# Patient Record
Sex: Female | Born: 1945 | Race: White | Hispanic: No | Marital: Married | Smoking: Never smoker
Health system: Southern US, Community
[De-identification: ages and names within clinical notes are randomized; demographics above are authoritative.]

## PROBLEM LIST (undated history)

## (undated) DIAGNOSIS — T7840XA Allergy, unspecified, initial encounter: Secondary | ICD-10-CM

## (undated) DIAGNOSIS — M1611 Unilateral primary osteoarthritis, right hip: Secondary | ICD-10-CM

## (undated) DIAGNOSIS — R112 Nausea with vomiting, unspecified: Secondary | ICD-10-CM

## (undated) DIAGNOSIS — N8110 Cystocele, unspecified: Secondary | ICD-10-CM

## (undated) DIAGNOSIS — E785 Hyperlipidemia, unspecified: Secondary | ICD-10-CM

## (undated) DIAGNOSIS — H269 Unspecified cataract: Secondary | ICD-10-CM

## (undated) DIAGNOSIS — Z9889 Other specified postprocedural states: Secondary | ICD-10-CM

## (undated) DIAGNOSIS — Z1211 Encounter for screening for malignant neoplasm of colon: Secondary | ICD-10-CM

## (undated) DIAGNOSIS — I8393 Asymptomatic varicose veins of bilateral lower extremities: Secondary | ICD-10-CM

## (undated) HISTORY — PX: EXCISION VAGINAL CYST: SHX5825

## (undated) HISTORY — DX: Unspecified cataract: H26.9

## (undated) HISTORY — DX: Unilateral primary osteoarthritis, right hip: M16.11

## (undated) HISTORY — DX: Allergy, unspecified, initial encounter: T78.40XA

## (undated) HISTORY — DX: Cystocele, unspecified: N81.10

## (undated) HISTORY — DX: Hyperlipidemia, unspecified: E78.5

---

## 1991-03-02 HISTORY — PX: BREAST BIOPSY: SHX20

## 1997-08-19 ENCOUNTER — Other Ambulatory Visit: Admission: RE | Admit: 1997-08-19 | Discharge: 1997-08-19 | Payer: Self-pay | Admitting: Family Medicine

## 2003-03-03 LAB — HM COLONOSCOPY: HM Colonoscopy: NORMAL

## 2003-12-31 ENCOUNTER — Ambulatory Visit: Payer: Self-pay | Admitting: Gastroenterology

## 2003-12-31 HISTORY — PX: COLONOSCOPY: SHX174

## 2004-12-09 ENCOUNTER — Ambulatory Visit: Payer: Self-pay | Admitting: Internal Medicine

## 2005-12-15 ENCOUNTER — Ambulatory Visit: Payer: Self-pay | Admitting: Internal Medicine

## 2005-12-21 ENCOUNTER — Ambulatory Visit: Payer: Self-pay | Admitting: Internal Medicine

## 2006-12-21 ENCOUNTER — Ambulatory Visit: Payer: Self-pay | Admitting: Internal Medicine

## 2008-01-08 ENCOUNTER — Ambulatory Visit: Payer: Self-pay | Admitting: Internal Medicine

## 2009-06-02 ENCOUNTER — Ambulatory Visit: Payer: Self-pay | Admitting: Family Medicine

## 2010-06-18 ENCOUNTER — Ambulatory Visit: Payer: Self-pay | Admitting: Internal Medicine

## 2011-07-18 ENCOUNTER — Ambulatory Visit: Payer: Self-pay | Admitting: Internal Medicine

## 2011-08-10 ENCOUNTER — Ambulatory Visit: Payer: Self-pay | Admitting: Internal Medicine

## 2012-02-13 ENCOUNTER — Ambulatory Visit: Payer: Self-pay | Admitting: Emergency Medicine

## 2012-02-13 LAB — URINALYSIS, COMPLETE
Bilirubin,UR: NEGATIVE
Glucose,UR: NEGATIVE mg/dL (ref 0–75)
Ketone: NEGATIVE
Specific Gravity: 1.02 (ref 1.003–1.030)
WBC UR: >30 /HPF (ref 0–5)

## 2012-08-29 ENCOUNTER — Ambulatory Visit: Payer: Self-pay | Admitting: Internal Medicine

## 2013-08-27 LAB — CBC AND DIFFERENTIAL: Hemoglobin: 13.5 g/dL (ref 12.0–16.0)

## 2013-08-27 LAB — LIPID PANEL
Cholesterol: 147 mg/dL (ref 0–200)
HDL: 52 mg/dL (ref 35–70)
LDL CALC: 80 mg/dL
Triglycerides: 73 mg/dL (ref 40–160)

## 2013-08-27 LAB — TSH: TSH: 2.6 u[IU]/mL (ref <=5.90)

## 2013-09-02 ENCOUNTER — Ambulatory Visit: Payer: Self-pay | Admitting: Emergency Medicine

## 2013-09-11 ENCOUNTER — Ambulatory Visit: Payer: Self-pay | Admitting: Internal Medicine

## 2013-09-11 LAB — HM MAMMOGRAPHY: HM Mammogram: NORMAL

## 2013-12-25 ENCOUNTER — Ambulatory Visit: Payer: Self-pay | Admitting: Internal Medicine

## 2014-01-08 ENCOUNTER — Ambulatory Visit: Payer: Self-pay | Admitting: Internal Medicine

## 2014-09-17 ENCOUNTER — Other Ambulatory Visit: Payer: Self-pay | Admitting: Internal Medicine

## 2014-09-17 ENCOUNTER — Encounter: Payer: Self-pay | Admitting: Internal Medicine

## 2014-09-17 DIAGNOSIS — N819 Female genital prolapse, unspecified: Secondary | ICD-10-CM | POA: Insufficient documentation

## 2014-09-17 DIAGNOSIS — M25562 Pain in left knee: Secondary | ICD-10-CM | POA: Insufficient documentation

## 2014-09-17 DIAGNOSIS — N6019 Diffuse cystic mastopathy of unspecified breast: Secondary | ICD-10-CM | POA: Insufficient documentation

## 2014-09-17 DIAGNOSIS — N951 Menopausal and female climacteric states: Secondary | ICD-10-CM | POA: Insufficient documentation

## 2014-09-17 DIAGNOSIS — J3089 Other allergic rhinitis: Secondary | ICD-10-CM | POA: Insufficient documentation

## 2014-11-21 ENCOUNTER — Ambulatory Visit (INDEPENDENT_AMBULATORY_CARE_PROVIDER_SITE_OTHER): Payer: Medicare Other

## 2014-11-21 ENCOUNTER — Other Ambulatory Visit: Payer: Self-pay | Admitting: Internal Medicine

## 2014-11-21 DIAGNOSIS — Z23 Encounter for immunization: Secondary | ICD-10-CM | POA: Diagnosis not present

## 2014-12-20 ENCOUNTER — Encounter: Payer: Self-pay | Admitting: Internal Medicine

## 2015-01-10 ENCOUNTER — Encounter: Payer: Self-pay | Admitting: Internal Medicine

## 2015-01-10 ENCOUNTER — Ambulatory Visit (INDEPENDENT_AMBULATORY_CARE_PROVIDER_SITE_OTHER): Payer: Medicare Other | Admitting: Internal Medicine

## 2015-01-10 VITALS — BP 132/72 | HR 72 | Ht 67.0 in | Wt 173.6 lb

## 2015-01-10 DIAGNOSIS — L089 Local infection of the skin and subcutaneous tissue, unspecified: Secondary | ICD-10-CM

## 2015-01-10 DIAGNOSIS — L723 Sebaceous cyst: Secondary | ICD-10-CM

## 2015-01-10 DIAGNOSIS — Z1239 Encounter for other screening for malignant neoplasm of breast: Secondary | ICD-10-CM

## 2015-01-10 MED ORDER — AMOXICILLIN-POT CLAVULANATE 875-125 MG PO TABS
1.0000 | ORAL_TABLET | Freq: Two times a day (BID) | ORAL | Status: DC
Start: 2015-01-10 — End: 2016-01-23

## 2015-01-10 NOTE — Progress Notes (Signed)
Date:  01/10/2015   Name:  Dana Cameron   DOB:  12-31-1945   MRN:  [id number]   Chief Complaint: Breast Mass HPI Patient noted onset of a lump in her medial upper left breast about a week ago. It was tender and swollen. She had some purulent drainage and now the area is smaller. She placed a Band-Aid over it and developed a rash from the adhesive. She is due for a mammogram. She denies any other breast mass, skin change, or nipple discharge. She has no previous history of sebaceous cyst or skin infections.   Review of Systems  Constitutional: Negative for fever, chills and fatigue.  Respiratory: Negative for chest tightness and shortness of breath.   Cardiovascular: Negative for chest pain.  Skin: Rash: and lump.    Patient Active Problem List   Diagnosis Date Noted  . 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 Allergic rhinitis 09/17/2014  . 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 Gonalgia 09/17/2014  . Hot flash, menopausal 09/17/2014    Prior to Admission medications   Medication Sig Start Date End Date Taking? Authorizing Provider  Cholecalciferol (VITAMIN D3) 5000 UNITS CAPS Take 1 capsule by mouth daily.   Yes Historical Provider, MD  conjugated estrogens (PREMARIN) vaginal cream Place 1 application vaginally 2 (two) times a week.   Yes Historical Provider, MD  fluticasone (FLONASE) 50 MCG/ACT nasal spray USE TWO SPRAY(S) IN EACH NOSTRIL ONCE DAILY 09/17/14  Yes Glean Hess, MD  Omega 3 1000 MG CAPS Take 1 capsule by mouth daily.   Yes Historical Provider, MD  Progesterone Micronized 10 % CREA Place onto the skin.   Yes Historical Provider, MD    No Known Allergies  Past Surgical History  Procedure Laterality Date  . Excision vaginal cyst      Social History  Substance Use Topics  . Smoking status: Never Smoker   . Smokeless tobacco: None  . Alcohol Use: No     Medication list has been reviewed and updated.   Physical Exam  Constitutional: She appears well-developed  and well-nourished. No distress.  Cardiovascular: Normal rate, regular rhythm and normal heart sounds.   Pulmonary/Chest: Effort normal and breath sounds normal. She has no wheezes. Right breast exhibits no mass, no nipple discharge, no skin change and no tenderness. Left breast exhibits mass. Left breast exhibits no nipple discharge, no skin change and no tenderness.    Nursing note and vitals reviewed.   BP 132/72 mmHg  Pulse 72  Ht 5\' 7"  (1.702 m)  Wt 173 lb 9.6 oz (78.744 kg)  BMI 27.18 kg/m2  Assessment and Plan: 1. Infected sebaceous cyst of skin Warm compresses 3 times a day - amoxicillin-clavulanate (AUGMENTIN) 875-125 MG tablet; Take 1 tablet by mouth 2 (two) times daily.  Dispense: 20 tablet; Refill: 0  2. Breast cancer screening - MM DIGITAL SCREENING BILATERAL; Future   Halina Maidens, MD [facility]  01/10/2015

## 2015-01-14 ENCOUNTER — Ambulatory Visit
Admission: RE | Admit: 2015-01-14 | Discharge: 2015-01-14 | Disposition: A | Discharge status: Home / Self Care | Payer: Medicare Other | Source: Ambulatory Visit | Attending: Internal Medicine | Admitting: Internal Medicine

## 2015-01-14 DIAGNOSIS — Z1239 Encounter for other screening for malignant neoplasm of breast: Secondary | ICD-10-CM

## 2015-01-14 DIAGNOSIS — Z1231 Encounter for screening mammogram for malignant neoplasm of breast: Secondary | ICD-10-CM | POA: Insufficient documentation

## 2015-01-21 ENCOUNTER — Encounter: Payer: Self-pay | Admitting: Internal Medicine

## 2015-01-21 ENCOUNTER — Ambulatory Visit (INDEPENDENT_AMBULATORY_CARE_PROVIDER_SITE_OTHER): Payer: Medicare Other | Admitting: Internal Medicine

## 2015-01-21 VITALS — BP 122/72 | HR 60 | Ht 67.0 in | Wt 174.4 lb

## 2015-01-21 DIAGNOSIS — N951 Menopausal and female climacteric states: Secondary | ICD-10-CM | POA: Diagnosis not present

## 2015-01-21 DIAGNOSIS — N6019 Diffuse cystic mastopathy of unspecified breast: Secondary | ICD-10-CM

## 2015-01-21 DIAGNOSIS — Z0000 Encounter for general adult medical examination without abnormal findings: Secondary | ICD-10-CM | POA: Diagnosis not present

## 2015-01-21 DIAGNOSIS — N8110 Cystocele, unspecified: Secondary | ICD-10-CM | POA: Diagnosis not present

## 2015-01-21 DIAGNOSIS — IMO0002 Reserved for concepts with insufficient information to code with codable children: Secondary | ICD-10-CM

## 2015-01-21 DIAGNOSIS — Z23 Encounter for immunization: Secondary | ICD-10-CM

## 2015-01-21 LAB — POCT URINALYSIS DIPSTICK
BILIRUBIN UA: NEGATIVE
GLUCOSE UA: NEGATIVE
KETONES UA: NEGATIVE
LEUKOCYTES UA: NEGATIVE
Nitrite, UA: NEGATIVE
PH UA: 5
Protein, UA: NEGATIVE
Spec Grav, UA: 1.01
Urobilinogen, UA: 0.2

## 2015-01-21 NOTE — Patient Instructions (Addendum)
Pneumococcal Conjugate Vaccine (PCV13)  1. Why get vaccinated? Vaccination can protect both children and adults from pneumococcal disease. Pneumococcal disease is caused by bacteria that can spread from person to person through close contact. It can cause ear infections, and it can also lead to more serious infections of the:  Lungs (pneumonia),  Blood (bacteremia), and  Covering of the brain and spinal cord (meningitis). Pneumococcal pneumonia is most common among adults. Pneumococcal meningitis can cause deafness and brain damage, and it kills about 1 child in 10 who get it. Anyone can get pneumococcal disease, but children under 28 years of age and adults 43 years and older, people with certain medical conditions, and cigarette smokers are at the highest risk. Before there was a vaccine, the [place] saw:  more than 700 cases of meningitis,  about 13,000 blood infections,  about 5 million ear infections, and  about 200 deaths in children under 5 each year from pneumococcal disease. Since vaccine became available, severe pneumococcal disease in these children has fallen by 88%. About 18,000 older adults die of pneumococcal disease each year in the [place]. Treatment of pneumococcal infections with penicillin and other drugs is not as effective as it used to be, because some strains of the disease have become resistant to these drugs. This makes prevention of the disease, through vaccination, even more important. 2. PCV13 vaccine Pneumococcal conjugate vaccine (called PCV13) protects against 13 types of pneumococcal bacteria. PCV13 is routinely given to children at 2, 4, 6, and 65-74 months of age. It is also recommended for children and adults 70 to 70 years of age with certain health conditions, and for all adults 64 years of age and older. Your doctor can give you details. 3. Some people should not get this vaccine Anyone who has ever had a life-threatening allergic reaction  to a dose of this vaccine, to an earlier pneumococcal vaccine called PCV7, or to any vaccine containing diphtheria toxoid (for example, DTaP), should not get PCV13. Anyone with a severe allergy to any component of PCV13 should not get the vaccine. Tell your doctor if the person being vaccinated has any severe allergies. If the person scheduled for vaccination is not feeling well, your healthcare provider might decide to reschedule the shot on another day. 4. Risks of a vaccine reaction With any medicine, including vaccines, there is a chance of reactions. These are usually mild and go away on their own, but serious reactions are also possible. Problems reported following PCV13 varied by age and dose in the series. The most common problems reported among children were:  About half became drowsy after the shot, had a temporary loss of appetite, or had redness or tenderness where the shot was given.  About 1 out of 3 had swelling where the shot was given.  About 1 out of 3 had a mild fever, and about 1 in 20 had a fever over 102.55F.  Up to about 8 out of 10 became fussy or irritable. Adults have reported pain, redness, and swelling where the shot was given; also mild fever, fatigue, headache, chills, or muscle pain. Young children who get PCV13 along with inactivated flu vaccine at the same time may be at increased risk for seizures caused by fever. Ask your doctor for more information. Problems that could happen after any vaccine:  People sometimes faint after a medical procedure, including vaccination. Sitting or lying down for about 15 minutes can help prevent fainting, and injuries caused by a fall.  Tell your doctor if you feel dizzy, or have vision changes or ringing in the ears.  Some older children and adults get severe pain in the shoulder and have difficulty moving the arm where a shot was given. This happens very rarely.  Any medication can cause a severe allergic reaction. Such  reactions from a vaccine are very rare, estimated at about 1 in a million doses, and would happen within a few minutes to a few hours after the vaccination. As with any medicine, there is a very small chance of a vaccine causing a serious injury or death. The safety of vaccines is always being monitored. For more information, visit: http://www.aguilar.org/ 5. What if there is a serious reaction? What should I look for?  Look for anything that concerns you, such as signs of a severe allergic reaction, very high fever, or unusual behavior. Signs of a severe allergic reaction can include hives, swelling of the face and throat, difficulty breathing, a fast heartbeat, dizziness, and weakness-usually within a few minutes to a few hours after the vaccination. What should I do?  If you think it is a severe allergic reaction or other emergency that can't wait, call 9-1-1 or get the person to the nearest hospital. Otherwise, call your doctor. Reactions should be reported to the Vaccine Adverse Event Reporting System (VAERS). Your doctor should file this report, or you can do it yourself through the VAERS web site at www.vaers.SamedayNews.es, or by calling [phone]. VAERS does not give medical advice. 6. The National Vaccine Injury Compensation Program The [employer] Vaccine Injury Compensation Program (VICP) is a federal program that was created to compensate people who may have been injured by certain vaccines. Persons who believe they may have been injured by a vaccine can learn about the program and about filing a claim by calling 484-371-6541 or visiting the [place] website at GoldCloset.com.ee. There is a time limit to file a claim for compensation. 7. How can I learn more?  Ask your healthcare provider. He or she can give you the vaccine package insert or suggest other sources of information.  Call your local or state health department.  Contact the Centers for Disease Control and  Prevention (CDC):  Call 667-509-9160 (1-800-CDC-INFO) or  Visit CDC's website at http://hunter.com/ Vaccine Information Statement PCV13 Vaccine (01/03/2014)   This information is not intended to replace advice given to you by your health care provider. Make sure you discuss any questions you have with your health care provider.   Document Released: 12/13/2005 Document Revised: 03/08/2014 Document Reviewed: 01/10/2014 Elsevier Interactive Patient Education 2016 [employer] Maintenance  Topic Date Due  . DEXA SCAN  11/01/2011  . COLONOSCOPY  03/02/2013  . INFLUENZA VACCINE  09/30/2015  . MAMMOGRAM  01/13/2017  . TETANUS/TDAP  08/26/2019  . ZOSTAVAX  Completed  . Hepatitis C Screening  Addressed  . PNA vac Low Risk Adult  Addressed  '

## 2015-01-21 NOTE — Progress Notes (Signed)
Patient: Dana Cameron, Female    DOB: [birth year], 69 y.o.   MRN: [id number] Visit Date: 01/21/2015  Today's Provider: Halina Maidens, MD   Chief Complaint  Patient presents with  . Medicare Wellness   Subjective:    Annual wellness visit Dana Cameron is a 69 y.o. female who presents today for her Subsequent Annual Wellness Visit. She feels well. She reports exercising none. She reports she is sleeping well.   ----------------------------------------------------------- HPI  Patient reports doing very well. She doesn't exercise but stays very busy. She still has mild issues with her cystocele but does not desire surgery. She occasionally has to strain to empty her bowels but is afraid to use a stool softener which might cause diarrhea. She has mild hot flashes which are tolerable and treated with topical progesterone. The cyst on her chest is almost completely resolved with a course of antibiotics. It did not drain any further after her last visit. She denies any breast lumps or nipple discharge. Recent mammogram was normal. She's due for a 10 year colonoscopy but declined to that in favor of stool Hemoccult cards.  Review of Systems  Constitutional: Negative for fever, chills, diaphoresis and fatigue.  HENT: Negative for hearing loss, tinnitus and trouble swallowing.   Eyes: Negative for visual disturbance.  Respiratory: Negative for cough, chest tightness, shortness of breath and wheezing.   Cardiovascular: Negative for chest pain, palpitations and leg swelling.  Gastrointestinal: Negative for abdominal pain, diarrhea and constipation.  Endocrine: Negative for polydipsia and polyuria.  Genitourinary: Negative for dysuria, frequency, hematuria, vaginal bleeding and vaginal discharge.  Musculoskeletal: Negative for back pain, joint swelling and gait problem.  Skin: Negative for rash.  Psychiatric/Behavioral: Negative for confusion, sleep disturbance and dysphoric mood.     Social History   Social History  . Marital Status: Married    Spouse Name: N/A  . Number of Children: N/A  . Years of Education: N/A   Occupational History  . Not on file.   Social History Main Topics  . Smoking status: Never Smoker   . Smokeless tobacco: Not on file  . Alcohol Use: No  . Drug Use: Not on file  . Sexual Activity: Not on file   Other Topics Concern  . Not on file   Social History Narrative    Patient Active Problem List   Diagnosis Date Noted  . 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 Allergic rhinitis 09/17/2014  . 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 Gonalgia 09/17/2014  . Hot flash, menopausal 09/17/2014    Past Surgical History  Procedure Laterality Date  . Excision vaginal cyst    . Breast biopsy Left 1993    neg    Her family history includes Hypertension in her father; Stroke in her father; Transient ischemic attack in her mother.    Previous Medications   AMOXICILLIN-CLAVULANATE (AUGMENTIN) 875-125 MG TABLET    Take 1 tablet by mouth 2 (two) times daily.   CHOLECALCIFEROL (VITAMIN D3) 5000 UNITS CAPS    Take 1 capsule by mouth daily.   CONJUGATED ESTROGENS (PREMARIN) VAGINAL CREAM    Place 1 application vaginally 2 (two) times a week.   FLUTICASONE (FLONASE) 50 MCG/ACT NASAL SPRAY    USE TWO SPRAY(S) IN EACH NOSTRIL ONCE DAILY   OMEGA 3 1000 MG CAPS    Take 1 capsule by mouth daily.   PROGESTERONE MICRONIZED 10 % CREA    Place onto the skin.    Patient Care Team: Glean Hess, MD  as PCP - General (Family Medicine)     Objective:   Vitals: BP 122/72 mmHg  Pulse 60  Ht 5\' 7"  (1.702 m)  Wt 174 lb 6.4 oz (79.107 kg)  BMI 27.31 kg/m2  Physical Exam  Constitutional: She is oriented to person, place, and time. She appears well-developed and well-nourished. No distress.  HENT:  Head: Normocephalic and atraumatic.  Right Ear: Tympanic membrane and ear canal normal.  Left Ear: Tympanic membrane and ear canal normal.  Nose: Right sinus  exhibits no maxillary sinus tenderness. Left sinus exhibits no maxillary sinus tenderness.  Mouth/Throat: Uvula is midline and oropharynx is clear and moist.  Eyes: Conjunctivae and EOM are normal. Right eye exhibits no discharge. Left eye exhibits no discharge. No scleral icterus.  Neck: Normal range of motion. Carotid bruit is not present. No erythema present. No thyromegaly present.  Cardiovascular: Normal rate, regular rhythm, normal heart sounds and normal pulses.   Pulmonary/Chest: Effort normal. No respiratory distress. She has no wheezes. Right breast exhibits no mass, no nipple discharge, no skin change and no tenderness. Left breast exhibits no mass, no nipple discharge, no skin change and no tenderness.    Abdominal: Soft. Bowel sounds are normal. There is no hepatosplenomegaly. There is no tenderness. There is no CVA tenderness.  Musculoskeletal: Normal range of motion.  Lymphadenopathy:    She has no cervical adenopathy.    She has no axillary adenopathy.  Neurological: She is alert and oriented to person, place, and time. She has normal strength and normal reflexes. No cranial nerve deficit or sensory deficit.  Skin: Skin is warm, dry and intact. No rash noted.  Psychiatric: She has a normal mood and affect. Her speech is normal and behavior is normal. Thought content normal. Cognition and memory are normal.  Nursing note and vitals reviewed.   Activities of Daily Living In your present state of health, do you have any difficulty performing the following activities: 01/21/2015  Hearing? N  Vision? N  Difficulty concentrating or making decisions? N  Walking or climbing stairs? N  Dressing or bathing? N  Doing errands, shopping? N    Fall Risk Assessment Fall Risk  01/21/2015  Falls in the past year? No     Patient reports there are safety devices in place in shower at home.   Depression Screen PHQ 2/9 Scores 01/21/2015  PHQ - 2 Score 0    Cognitive Testing -  6-CIT   Correct? Score   What year is it? yes 0 Yes = 0    No = 4  What month is it? yes 0 Yes = 0    No = 3  Remember:     [address]ia [address], [place], [place]     What time is it? yes 0 Yes = 0    No = 3  Count backwards from 20 to 1 yes 0 Correct = 0    1 error = 2   More than 1 error = 4  Say the months of the year in reverse. yes 0 Correct = 0    1 error = 2   More than 1 error = 4  What address did I ask you to remember? yes 0 Correct = 0  1 error = 2    2 error = 4    3 error = 6    4 error = 8    All wrong = 10       TOTAL SCORE  0/28   Interpretation:  Normal  Normal (0-7) Abnormal (8-28)        Assessment & Plan:     Annual Wellness Visit  Reviewed patient's Family Medical History Reviewed and updated list of patient's medical providers Assessment of cognitive impairment was done Assessed patient's functional ability Established a written schedule for health screening [place] Completed and Reviewed  Exercise Activities and Dietary recommendations Goals    . Increase physical activity       Immunization History  Administered Date(s) Administered  . Influenza,inj,Quad PF,36+ Mos 11/21/2014  . Pneumococcal Polysaccharide-23 08/24/2012  . Tdap 08/25/2009  . Zoster 06/22/2010    Health Maintenance  Topic Date Due  . DEXA SCAN  11/01/2011  . COLONOSCOPY  03/02/2013  . INFLUENZA VACCINE  09/30/2015  . MAMMOGRAM  01/13/2017  . TETANUS/TDAP  08/26/2019  . ZOSTAVAX  Completed  . Hepatitis C Screening  Addressed  . PNA vac Low Risk Adult  Addressed     Discussed health benefits of physical activity, and encouraged her to engage in regular exercise appropriate for her age and condition.    ----------------------------------------------------------------------------------------------- 1. Medicare annual wellness visit, subsequent Medicare annual wellness measures are satisfied Hep C screening is not needed since patient donates red  blood cells regularly - POCT urinalysis dipstick  2. Annual physical exam Will obtain routine labs and lipid panel Patient encouraged to begin regular aerobic exercise - CBC with Differential/Platelet - Comprehensive metabolic panel - TSH - Lipid panel  3. Fibrocystic breast, unspecified laterality Recent mammogram was normal Continue regular self breast exams  4. Hot flash, menopausal Mild and stable; continue Premarin vaginal cream and topical progesterone  5. Bladder cystocele Symptoms are tolerable without evidence of urinary tract infection  6. Need for pneumococcal vaccination - Pneumococcal conjugate vaccine 13-valent IM   Halina Maidens, MD [facility]  01/21/2015

## 2015-01-22 LAB — LIPID PANEL
Chol/HDL Ratio: 2.9 ratio units (ref 0.0–4.4)
Cholesterol, Total: 155 mg/dL (ref 100–199)
HDL: 54 mg/dL (ref >39)
LDL Calculated: 79 mg/dL (ref 0–99)
Triglycerides: 110 mg/dL (ref 0–149)
VLDL CHOLESTEROL CAL: 22 mg/dL (ref 5–40)

## 2015-01-22 LAB — TSH: TSH: 2.31 u[IU]/mL (ref 0.450–4.500)

## 2015-01-22 LAB — COMPREHENSIVE METABOLIC PANEL
ALK PHOS: 85 IU/L (ref 39–117)
ALT: 14 IU/L (ref 0–32)
AST: 19 IU/L (ref 0–40)
Albumin/Globulin Ratio: 1.8 (ref 1.1–2.5)
Albumin: 4.3 g/dL (ref 3.6–4.8)
BUN/Creatinine Ratio: 15 (ref 11–26)
BUN: 12 mg/dL (ref 8–27)
Bilirubin Total: 0.5 mg/dL (ref 0.0–1.2)
CO2: 24 mmol/L (ref 18–29)
Calcium: 9.2 mg/dL (ref 8.7–10.3)
Chloride: 103 mmol/L (ref 97–106)
Creatinine, Ser: 0.78 mg/dL (ref 0.57–1.00)
GFR calc Af Amer: 90 mL/min/{1.73_m2} (ref >59)
GFR, EST NON AFRICAN AMERICAN: 78 mL/min/{1.73_m2} (ref >59)
GLOBULIN, TOTAL: 2.4 g/dL (ref 1.5–4.5)
Glucose: 81 mg/dL (ref 65–99)
Potassium: 4.3 mmol/L (ref 3.5–5.2)
SODIUM: 141 mmol/L (ref 136–144)
Total Protein: 6.7 g/dL (ref 6.0–8.5)

## 2015-01-22 LAB — CBC WITH DIFFERENTIAL/PLATELET
Basophils Absolute: 0 10*3/uL (ref 0.0–0.2)
Basos: 1 %
EOS (ABSOLUTE): 0.1 10*3/uL (ref 0.0–0.4)
EOS: 1 %
Hematocrit: 43.6 % (ref 34.0–46.6)
Hemoglobin: 14.9 g/dL (ref 11.1–15.9)
IMMATURE GRANS (ABS): 0 10*3/uL (ref 0.0–0.1)
Immature Granulocytes: 0 %
LYMPHS ABS: 2 10*3/uL (ref 0.7–3.1)
Lymphs: 36 %
MCH: 31.1 pg (ref 26.6–33.0)
MCHC: 34.2 g/dL (ref 31.5–35.7)
MCV: 91 fL (ref 79–97)
MONOCYTES: 8 %
Monocytes Absolute: 0.4 10*3/uL (ref 0.1–0.9)
NEUTROS ABS: 3.1 10*3/uL (ref 1.4–7.0)
Neutrophils: 54 %
Platelets: 297 10*3/uL (ref 150–379)
RBC: 4.79 x10E6/uL (ref 3.77–5.28)
RDW: 14.2 % (ref 12.3–15.4)
WBC: 5.7 10*3/uL (ref 3.4–10.8)

## 2015-02-25 ENCOUNTER — Encounter: Payer: Self-pay | Admitting: Internal Medicine

## 2015-03-28 ENCOUNTER — Encounter: Payer: Self-pay | Admitting: Internal Medicine

## 2015-03-28 DIAGNOSIS — E785 Hyperlipidemia, unspecified: Secondary | ICD-10-CM | POA: Insufficient documentation

## 2015-03-28 DIAGNOSIS — K5900 Constipation, unspecified: Secondary | ICD-10-CM | POA: Insufficient documentation

## 2016-01-23 ENCOUNTER — Other Ambulatory Visit: Payer: Self-pay | Admitting: Internal Medicine

## 2016-01-26 ENCOUNTER — Encounter: Payer: Self-pay | Admitting: Internal Medicine

## 2016-01-26 ENCOUNTER — Ambulatory Visit (INDEPENDENT_AMBULATORY_CARE_PROVIDER_SITE_OTHER): Payer: Medicare Other | Admitting: Internal Medicine

## 2016-01-26 VITALS — BP 132/82 | HR 74 | Resp 16 | Ht 67.0 in | Wt 172.0 lb

## 2016-01-26 DIAGNOSIS — Z0000 Encounter for general adult medical examination without abnormal findings: Secondary | ICD-10-CM | POA: Diagnosis not present

## 2016-01-26 DIAGNOSIS — J3089 Other allergic rhinitis: Secondary | ICD-10-CM | POA: Diagnosis not present

## 2016-01-26 DIAGNOSIS — Z1231 Encounter for screening mammogram for malignant neoplasm of breast: Secondary | ICD-10-CM

## 2016-01-26 DIAGNOSIS — M1611 Unilateral primary osteoarthritis, right hip: Secondary | ICD-10-CM | POA: Diagnosis not present

## 2016-01-26 DIAGNOSIS — R21 Rash and other nonspecific skin eruption: Secondary | ICD-10-CM | POA: Diagnosis not present

## 2016-01-26 DIAGNOSIS — Z23 Encounter for immunization: Secondary | ICD-10-CM

## 2016-01-26 DIAGNOSIS — E785 Hyperlipidemia, unspecified: Secondary | ICD-10-CM

## 2016-01-26 LAB — POCT URINALYSIS DIPSTICK
Bilirubin, UA: NEGATIVE
Blood, UA: NEGATIVE
GLUCOSE UA: NEGATIVE
KETONES UA: NEGATIVE
Leukocytes, UA: NEGATIVE
NITRITE UA: NEGATIVE
Protein, UA: NEGATIVE
Spec Grav, UA: 1.025
UROBILINOGEN UA: 0.2
pH, UA: 5

## 2016-01-26 NOTE — Progress Notes (Signed)
Patient: Dana Cameron, Female    DOB: 01-Feb-1946, 70 y.o.   MRN: [id number] Visit Date: 01/26/2016  Today's Provider: Halina Maidens, MD   Chief Complaint  Patient presents with  . Medicare Wellness   Subjective:    Annual wellness visit Dana Cameron is a 70 y.o. female who presents today for her Subsequent Annual Wellness Visit. She feels well. She reports exercising none. She reports she is sleeping well.   ----------------------------------------------------------- Rash  This is a new problem. The current episode started 1 to 4 weeks ago. The problem has been gradually improving since onset. The affected locations include the face. The rash is characterized by redness. She was exposed to a new medication (anti-aging cream). Pertinent negatives include no congestion, cough, diarrhea, fatigue, fever, shortness of breath or vomiting. Past treatments include topical steroids. The treatment provided significant relief.  Hip Pain   There was no injury mechanism. The pain is present in the right hip. The quality of the pain is described as aching. The patient is experiencing no pain. Exacerbated by: getting up from sitting position. She has tried nothing for the symptoms.    Review of Systems  Constitutional: Negative for chills, fatigue and fever.  HENT: Positive for postnasal drip. Negative for congestion, hearing loss, sinus pressure, tinnitus, trouble swallowing and voice change.   Eyes: Negative for visual disturbance.  Respiratory: Negative for cough, chest tightness, shortness of breath and wheezing.   Cardiovascular: Negative for chest pain, palpitations and leg swelling.  Gastrointestinal: Negative for abdominal pain, blood in stool, constipation, diarrhea and vomiting.  Endocrine: Negative for polydipsia and polyuria.  Genitourinary: Negative for dysuria, frequency, genital sores, vaginal bleeding and vaginal discharge.  Musculoskeletal: Positive for arthralgias  (right hip and knee OA). Negative for gait problem and joint swelling.  Skin: Positive for rash. Negative for color change.  Allergic/Immunologic: Positive for environmental allergies.  Neurological: Negative for dizziness, tremors, light-headedness and headaches.  Hematological: Negative for adenopathy. Does not bruise/bleed easily.  Psychiatric/Behavioral: Negative for dysphoric mood and sleep disturbance. The patient is not nervous/anxious.     Social History   Social History  . Marital status: Married    Spouse name: N/A  . Number of children: N/A  . Years of education: N/A   Occupational History  . Not on file.   Social History Main Topics  . Smoking status: Never Smoker  . Smokeless tobacco: Never Used  . Alcohol use No  . Drug use: Unknown  . Sexual activity: Not on file   Other Topics Concern  . Not on file   Social History Narrative  . No narrative on file    Patient Active Problem List   Diagnosis Date Noted  . Hyperlipidemia, mild 03/28/2015  . Constipation 03/28/2015  . Fibrocystic breast 09/17/2014  . Environmental and seasonal allergies 09/17/2014  . 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 Gonalgia 09/17/2014  . Hot flash, menopausal 09/17/2014    Past Surgical History:  Procedure Laterality Date  . BREAST BIOPSY Left 1993   neg  . EXCISION VAGINAL CYST      Her family history includes Hypertension in her father; Stroke in her father; Transient ischemic attack in her mother.     Previous Medications   CHOLECALCIFEROL (VITAMIN D3) 5000 UNITS CAPS    Take 1 capsule by mouth daily.   CONJUGATED ESTROGENS (PREMARIN) VAGINAL CREAM    Place 1 application vaginally 2 (two) times a week.   FLUTICASONE (FLONASE) 50 MCG/ACT NASAL SPRAY  USE TWO SPRAY(S) IN EACH NOSTRIL ONCE DAILY   OMEGA 3 1000 MG CAPS    Take 1 capsule by mouth daily.   PROGESTERONE MICRONIZED 10 % CREA    Place onto the skin.    Patient Care Team: Glean Hess, MD as PCP - General  (Family Medicine)      Objective:   Vitals: BP 132/82   Pulse 74   Resp 16   Ht 5\' 7"  (1.702 m)   Wt 172 lb (78 kg)   SpO2 100%   BMI 26.94 kg/m   Physical Exam  Constitutional: She is oriented to person, place, and time. She appears well-developed and well-nourished. No distress.  HENT:  Head: Normocephalic and atraumatic.  Right Ear: Tympanic membrane and ear canal normal.  Left Ear: Tympanic membrane and ear canal normal.  Nose: Right sinus exhibits no maxillary sinus tenderness. Left sinus exhibits no maxillary sinus tenderness.  Mouth/Throat: Uvula is midline and oropharynx is clear and moist.  Eyes: Conjunctivae and EOM are normal. Right eye exhibits no discharge. Left eye exhibits no discharge. No scleral icterus.  Neck: Normal range of motion. Carotid bruit is not present. No erythema present. No thyromegaly present.  Cardiovascular: Normal rate, regular rhythm, normal heart sounds and normal pulses.   Pulmonary/Chest: Effort normal. No respiratory distress. She has no wheezes. Right breast exhibits no mass, no nipple discharge, no skin change and no tenderness. Left breast exhibits no mass, no nipple discharge, no skin change and no tenderness (mild discomfort inferior breast with fibrocystic changes noted).  Abdominal: Soft. Bowel sounds are normal. There is no hepatosplenomegaly. There is no tenderness. There is no CVA tenderness.  Musculoskeletal:       Right hip: She exhibits decreased range of motion (to external rotation) and tenderness.  Lymphadenopathy:    She has no cervical adenopathy.    She has no axillary adenopathy.  Neurological: She is alert and oriented to person, place, and time. She has normal reflexes. No cranial nerve deficit or sensory deficit.  Skin: Skin is warm, dry and intact. Rash (fine erythematous rash on left cheek) noted.  Psychiatric: She has a normal mood and affect. Her speech is normal and behavior is normal. Thought content normal.    Nursing note and vitals reviewed.   Activities of Daily Living In your present state of health, do you have any difficulty performing the following activities: 01/26/2016  Hearing? N  Vision? N  Difficulty concentrating or making decisions? N  Walking or climbing stairs? N  Dressing or bathing? N  Doing errands, shopping? N  Preparing Food and eating ? N  Using the Toilet? N  In the past six months, have you accidently leaked urine? N  Do you have problems with loss of bowel control? N  Managing your Medications? N  Managing your Finances? N  Housekeeping or managing your Housekeeping? N  Some recent data might be hidden    Fall Risk Assessment Fall Risk  01/26/2016 01/21/2015  Falls in the past year? No No    Depression Screen PHQ 2/9 Scores 01/26/2016 01/21/2015  PHQ - 2 Score 0 0   6CIT Screen 01/26/2016  What Year? 0 points  What month? 0 points  What time? 0 points  Count back from 20 0 points  Months in reverse 0 points  Repeat phrase 0 points  Total Score 0    Medicare Annual Wellness Visit Summary:  Reviewed patient's Family Medical History Reviewed and updated list of  patient's medical providers Assessment of cognitive impairment was done Assessed patient's functional ability Established a written schedule for health screening [place] Completed and Reviewed  Exercise Activities and Dietary recommendations Goals    . Increase physical activity       Immunization History  Administered Date(s) Administered  . Influenza,inj,Quad PF,36+ Mos 11/21/2014  . Pneumococcal Conjugate-13 01/21/2015  . Pneumococcal Polysaccharide-23 08/24/2012  . Tdap 08/25/2009  . Zoster 06/22/2010    Health Maintenance  Topic Date Due  . MAMMOGRAM  01/14/2016  . COLONOSCOPY  03/01/2018 (Originally 03/02/2013)  . TETANUS/TDAP  08/26/2019  . INFLUENZA VACCINE  Completed  . DEXA SCAN  Addressed  . ZOSTAVAX  Completed  . Hepatitis C Screening   Addressed  . PNA vac Low Risk Adult  Completed    Discussed health benefits of physical activity, and encouraged her to engage in regular exercise appropriate for her age and condition.    ------------------------------------------------------------------------------------------------------------  Assessment & Plan:  1. Medicare annual wellness visit, subsequent Measures satisfied - POCT urinalysis dipstick  2. Hyperlipidemia, mild Continue diet; resume exercise - Comprehensive metabolic panel - Lipid panel  3. Environmental and seasonal allergies Resume flonase PRN  4. Encounter for screening mammogram for breast cancer - MM DIGITAL SCREENING BILATERAL; Future  5. Rash and nonspecific skin eruption May continue OTC cortisone until resolved Consider Derm follow up if recurrent - TSH - CBC with Differential/Platelet  6. Arthritis of right hip Tylenol PRN   Halina Maidens, MD [facility]  01/26/2016

## 2016-01-26 NOTE — Patient Instructions (Signed)
Health Maintenance  Topic Date Due  . MAMMOGRAM  01/14/2016  . COLONOSCOPY  03/01/2018 (Originally 03/02/2013)  . TETANUS/TDAP  08/26/2019  . INFLUENZA VACCINE  Completed  . DEXA SCAN  Addressed  . ZOSTAVAX  Completed  . Hepatitis C Screening  Addressed  . PNA vac Low Risk Adult  Completed    Breast Self-Awareness Introduction Breast self-awareness means being familiar with how your breasts look and feel. It involves checking your breasts regularly and reporting any changes to your health care provider. Practicing breast self-awareness is important. A change in your breasts can be a sign of a serious medical problem. Being familiar with how your breasts look and feel allows you to find any problems early, when treatment is more likely to be successful. All women should practice breast self-awareness, including women who have had breast implants. How to do a breast self-exam One way to learn what is normal for your breasts and whether your breasts are changing is to do a breast self-exam. To do a breast self-exam: Look for Changes  1. Remove all the clothing above your waist. 2. Stand in front of a mirror in a room with good lighting. 3. Put your hands on your hips. 4. Push your hands firmly downward. 5. Compare your breasts in the mirror. Look for differences between them (asymmetry), such as:  Differences in shape.  Differences in size.  Puckers, dips, and bumps in one breast and not the other. 6. Look at each breast for changes in your skin, such as:  Redness.  Scaly areas. 7. Look for changes in your nipples, such as:  Discharge.  Bleeding.  Dimpling.  Redness.  A change in position. Feel for Changes  Carefully feel your breasts for lumps and changes. It is best to do this while lying on your back on the floor and again while sitting or standing in the shower or tub with soapy water on your skin. Feel each breast in the following way:  Place the arm on the side of  the breast you are examining above your head.  Feel your breast with the other hand.  Start in the nipple area and make  inch (2 cm) overlapping circles to feel your breast. Use the pads of your three middle fingers to do this. Apply light pressure, then medium pressure, then firm pressure. The light pressure will allow you to feel the tissue closest to the skin. The medium pressure will allow you to feel the tissue that is a little deeper. The firm pressure will allow you to feel the tissue close to the ribs.  Continue the overlapping circles, moving downward over the breast until you feel your ribs below your breast.  Move one finger-width toward the center of the body. Continue to use the  inch (2 cm) overlapping circles to feel your breast as you move slowly up toward your collarbone.  Continue the up and down exam using all three pressures until you reach your armpit. Write Down What You Find  Write down what is normal for each breast and any changes that you find. Keep a written record with breast changes or normal findings for each breast. By writing this information down, you do not need to depend only on memory for size, tenderness, or location. Write down where you are in your menstrual cycle, if you are still menstruating. If you are having trouble noticing differences in your breasts, do not get discouraged. With time you will become more familiar with  the variations in your breasts and more comfortable with the exam. How often should I examine my breasts? Examine your breasts every month. If you are breastfeeding, the best time to examine your breasts is after a feeding or after using a breast pump. If you menstruate, the best time to examine your breasts is 5-7 days after your period is over. During your period, your breasts are lumpier, and it may be more difficult to notice changes. When should I see my health care provider? See your health care provider if you notice:  A change  in shape or size of your breasts or nipples.  A change in the skin of your breast or nipples, such as a reddened or scaly area.  Unusual discharge from your nipples.  A lump or thick area that was not there before.  Pain in your breasts.  Anything that concerns you. This information is not intended to replace advice given to you by your health care provider. Make sure you discuss any questions you have with your health care provider. Document Released: 02/15/2005 Document Revised: 07/24/2015 Document Reviewed: 01/05/2015  2017 Elsevier

## 2016-01-27 LAB — COMPREHENSIVE METABOLIC PANEL
A/G RATIO: 1.6 (ref 1.2–2.2)
ALBUMIN: 4.3 g/dL (ref 3.6–4.8)
ALK PHOS: 89 IU/L (ref 39–117)
ALT: 15 IU/L (ref 0–32)
AST: 17 IU/L (ref 0–40)
BUN / CREAT RATIO: 13 (ref 12–28)
BUN: 11 mg/dL (ref 8–27)
Bilirubin Total: 0.3 mg/dL (ref 0.0–1.2)
CALCIUM: 9 mg/dL (ref 8.7–10.3)
CO2: 23 mmol/L (ref 18–29)
CREATININE: 0.85 mg/dL (ref 0.57–1.00)
Chloride: 103 mmol/L (ref 96–106)
GFR calc Af Amer: 81 mL/min/{1.73_m2} (ref >59)
GFR, EST NON AFRICAN AMERICAN: 70 mL/min/{1.73_m2} (ref >59)
GLOBULIN, TOTAL: 2.7 g/dL (ref 1.5–4.5)
Glucose: 97 mg/dL (ref 65–99)
POTASSIUM: 4.7 mmol/L (ref 3.5–5.2)
SODIUM: 142 mmol/L (ref 134–144)
Total Protein: 7 g/dL (ref 6.0–8.5)

## 2016-01-27 LAB — CBC WITH DIFFERENTIAL/PLATELET
BASOS ABS: 0 10*3/uL (ref 0.0–0.2)
BASOS: 1 %
EOS (ABSOLUTE): 0 10*3/uL (ref 0.0–0.4)
Eos: 1 %
Hematocrit: 41.6 % (ref 34.0–46.6)
Hemoglobin: 13.6 g/dL (ref 11.1–15.9)
IMMATURE GRANS (ABS): 0 10*3/uL (ref 0.0–0.1)
IMMATURE GRANULOCYTES: 0 %
LYMPHS: 35 %
Lymphocytes Absolute: 1.6 10*3/uL (ref 0.7–3.1)
MCH: 29.8 pg (ref 26.6–33.0)
MCHC: 32.7 g/dL (ref 31.5–35.7)
MCV: 91 fL (ref 79–97)
MONOS ABS: 0.4 10*3/uL (ref 0.1–0.9)
Monocytes: 8 %
NEUTROS PCT: 55 %
Neutrophils Absolute: 2.5 10*3/uL (ref 1.4–7.0)
PLATELETS: 302 10*3/uL (ref 150–379)
RBC: 4.57 x10E6/uL (ref 3.77–5.28)
RDW: 14.1 % (ref 12.3–15.4)
WBC: 4.5 10*3/uL (ref 3.4–10.8)

## 2016-01-27 LAB — TSH: TSH: 1.76 u[IU]/mL (ref 0.450–4.500)

## 2016-01-27 LAB — LIPID PANEL
CHOL/HDL RATIO: 3 ratio (ref 0.0–4.4)
Cholesterol, Total: 170 mg/dL (ref 100–199)
HDL: 56 mg/dL (ref >39)
LDL CALC: 88 mg/dL (ref 0–99)
TRIGLYCERIDES: 132 mg/dL (ref 0–149)
VLDL Cholesterol Cal: 26 mg/dL (ref 5–40)

## 2016-02-03 ENCOUNTER — Ambulatory Visit
Admission: RE | Admit: 2016-02-03 | Discharge: 2016-02-03 | Disposition: A | Discharge status: Home / Self Care | Payer: Medicare Other | Source: Ambulatory Visit | Attending: Internal Medicine | Admitting: Internal Medicine

## 2016-02-03 ENCOUNTER — Other Ambulatory Visit: Payer: Self-pay | Admitting: Internal Medicine

## 2016-02-03 DIAGNOSIS — Z1231 Encounter for screening mammogram for malignant neoplasm of breast: Secondary | ICD-10-CM

## 2016-08-23 ENCOUNTER — Ambulatory Visit (INDEPENDENT_AMBULATORY_CARE_PROVIDER_SITE_OTHER): Payer: Medicare Other | Admitting: Internal Medicine

## 2016-08-23 ENCOUNTER — Encounter: Payer: Self-pay | Admitting: Internal Medicine

## 2016-08-23 ENCOUNTER — Ambulatory Visit: Payer: Self-pay | Admitting: Internal Medicine

## 2016-08-23 VITALS — BP 118/76 | HR 75 | Temp 97.8°F | Ht 67.0 in | Wt 178.0 lb

## 2016-08-23 DIAGNOSIS — N3000 Acute cystitis without hematuria: Secondary | ICD-10-CM | POA: Diagnosis not present

## 2016-08-23 LAB — POCT URINALYSIS DIPSTICK
BILIRUBIN UA: NEGATIVE
GLUCOSE UA: NEGATIVE
KETONES UA: NEGATIVE
NITRITE UA: NEGATIVE
Spec Grav, UA: 1.02 (ref 1.010–1.025)
Urobilinogen, UA: 0.2 E.U./dL
pH, UA: 6 (ref 5.0–8.0)

## 2016-08-23 MED ORDER — CIPROFLOXACIN HCL 250 MG PO TABS: 250.0000 mg | ORAL_TABLET | Freq: Two times a day (BID) | ORAL | 0 refills | Status: DC

## 2016-08-23 NOTE — Progress Notes (Signed)
Date:  08/23/2016   Name:  Dana Cameron   DOB:  1945/11/25   MRN:  [id number]   Chief Complaint: Urinary Tract Infection (Burning and itching. Started mid-last week. Tried AZO. Last day was 4 days ago. Still not better. Constantly going to bathroom. Feels full all the time. Urine is also cloudy. ) Urinary Tract Infection   This is a new problem. The current episode started in the past 7 days. The problem occurs every urination. The problem has been unchanged. The quality of the pain is described as burning. The patient is experiencing no pain. There has been no fever. Associated symptoms include frequency and urgency. Pertinent negatives include no chills, flank pain, nausea or vomiting. She has tried acetaminophen for the symptoms. The treatment provided no relief.      Review of Systems  Constitutional: Negative for chills.  Respiratory: Negative for chest tightness.   Cardiovascular: Negative for chest pain.  Gastrointestinal: Negative for abdominal pain, diarrhea, nausea and vomiting.  Genitourinary: Positive for frequency and urgency. Negative for flank pain.    Patient Active Problem List   Diagnosis Date Noted  . Arthritis of right hip 01/26/2016  . Hyperlipidemia, mild 03/28/2015  . Constipation 03/28/2015  . Fibrocystic breast 09/17/2014  . Environmental and seasonal allergies 09/17/2014  . 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 Gonalgia 09/17/2014  . Hot flash, menopausal 09/17/2014    Prior to Admission medications   Medication Sig Start Date End Date Taking? Authorizing Provider  Cholecalciferol (VITAMIN D3) 5000 UNITS CAPS Take 1 capsule by mouth daily.   Yes [provider]  fluticasone (FLONASE) 50 MCG/ACT nasal spray USE TWO SPRAY(S) IN EACH NOSTRIL ONCE DAILY 09/17/14  Yes Glean Hess, MD  Omega 3 1000 MG CAPS Take 1 capsule by mouth daily.   Yes [provider]  Progesterone Micronized 10 % CREA Place onto the skin.   Yes [provider]    No Known Allergies  Past Surgical History:  Procedure Laterality Date  . BREAST BIOPSY Left 1993   neg  . EXCISION VAGINAL CYST      Social History  Substance Use Topics  . Smoking status: Never Smoker  . Smokeless tobacco: Never Used  . Alcohol use No     Medication list has been reviewed and updated.   Physical Exam  Constitutional: She appears well-developed and well-nourished.  Cardiovascular: Normal rate, regular rhythm and normal heart sounds.   Pulmonary/Chest: Effort normal and breath sounds normal. No respiratory distress.  Abdominal: Soft. Bowel sounds are normal. There is tenderness in the suprapubic area. There is no rebound, no guarding and no CVA tenderness.  Psychiatric: She has a normal mood and affect.  Nursing note and vitals reviewed.   BP 118/76   Pulse 75   Temp 97.8 F (36.6 C)   Ht 5\' 7"  (1.702 m)   Wt 178 lb (80.7 kg)   SpO2 97%   BMI 27.88 kg/m   Assessment and Plan: 1. Acute cystitis without hematuria Continue fluid intake - POCT urinalysis dipstick - ciprofloxacin (CIPRO) 250 MG tablet; Take 1 tablet (250 mg total) by mouth 2 (two) times daily.  Dispense: 14 tablet; Refill: 0   Meds ordered this encounter  Medications  . ciprofloxacin (CIPRO) 250 MG tablet    Sig: Take 1 tablet (250 mg total) by mouth 2 (two) times daily.    Dispense:  14 tablet    Refill:  0    Halina Maidens,  MD [facility]  08/23/2016

## 2016-08-23 NOTE — Patient Instructions (Signed)

## 2016-09-15 ENCOUNTER — Other Ambulatory Visit: Payer: Self-pay | Admitting: Internal Medicine

## 2016-09-15 MED ORDER — BIEST/PROGESTERONE TD CREA: 1.0000 mL | TOPICAL_CREAM | Freq: Every day | TRANSDERMAL | 5 refills | Status: DC

## 2016-09-15 MED ORDER — NONFORMULARY OR COMPOUNDED ITEM: 5 refills | Status: DC

## 2016-11-24 ENCOUNTER — Encounter: Payer: Self-pay | Admitting: Internal Medicine

## 2016-11-24 ENCOUNTER — Ambulatory Visit (INDEPENDENT_AMBULATORY_CARE_PROVIDER_SITE_OTHER): Payer: Medicare Other | Admitting: Internal Medicine

## 2016-11-24 VITALS — BP 112/78 | HR 77 | Ht 67.0 in | Wt 176.0 lb

## 2016-11-24 DIAGNOSIS — L247 Irritant contact dermatitis due to plants, except food: Secondary | ICD-10-CM

## 2016-11-24 MED ORDER — PREDNISONE 10 MG PO TABS: ORAL_TABLET | ORAL | 0 refills | Status: DC

## 2016-11-24 NOTE — Progress Notes (Signed)
Date:  11/24/2016   Name:  Dana Cameron   DOB:  12-Nov-1945   MRN:  [id number]   Chief Complaint: Poison Ivy (Was cleaning up mothers yard Saturday- pulling weeds. Rash popped up yesterday and is spreading. Itching but not painful. Rash is also spread on right cheek of face )  Poison Ivy  This is a new problem. The current episode started in the past 7 days. The problem has been gradually worsening since onset. The affected locations include the face, left arm and right lower leg. The rash is characterized by itchiness and blistering.     Review of Systems  Respiratory: Negative.   Cardiovascular: Negative.   Gastrointestinal: Negative.   Skin: Positive for color change and rash.    Patient Active Problem List   Diagnosis Date Noted  . Arthritis of right hip 01/26/2016  . Hyperlipidemia, mild 03/28/2015  . Constipation 03/28/2015  . Fibrocystic breast 09/17/2014  . Environmental and seasonal allergies 09/17/2014  . 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 Gonalgia 09/17/2014  . Hot flash, menopausal 09/17/2014    Prior to Admission medications   Medication Sig Start Date End Date Taking? Authorizing Provider  Cholecalciferol (VITAMIN D3) 5000 UNITS CAPS Take 1 capsule by mouth daily.   Yes [provider]  Estradiol-Estriol-Progesterone (BIEST/PROGESTERONE) CREA Place 1 mL onto the skin daily. 80/20 strength 09/15/16  Yes Glean Hess, MD  fluticasone [facility]) 50 MCG/ACT nasal spray USE TWO SPRAY(S) IN EACH NOSTRIL ONCE DAILY 09/17/14  Yes Glean Hess, MD  NONFORMULARY OR COMPOUNDED ITEM Progesterone 3.5% HRT - apply 1-2 ml topically daily 09/15/16  Yes Glean Hess, MD  Omega 3 1000 MG CAPS Take 1 capsule by mouth daily.   Yes [provider]    No Known Allergies  Past Surgical History:  Procedure Laterality Date  . BREAST BIOPSY Left 1993   neg  . EXCISION VAGINAL CYST      Social History  Substance Use Topics  . Smoking status:  Never Smoker  . Smokeless tobacco: Never Used  . Alcohol use No     Medication list has been reviewed and updated.  PHQ 2/9 Scores 01/26/2016 01/21/2015  PHQ - 2 Score 0 0    Physical Exam  Constitutional: She is oriented to person, place, and time. She appears well-developed. No distress.  HENT:  Head: Normocephalic and atraumatic.  Cardiovascular: Normal rate, regular rhythm and normal heart sounds.   Pulmonary/Chest: Effort normal and breath sounds normal. No respiratory distress.  Musculoskeletal: Normal range of motion.  Neurological: She is alert and oriented to person, place, and time.  Skin: Skin is warm and dry. No rash noted.  Vesicular rash in patches on left inner arm, right lower leg, right face c/w contact dermatitis.  Psychiatric: She has a normal mood and affect. Her behavior is normal. Thought content normal.  Nursing note and vitals reviewed.   BP 112/78 (BP Location: Right Arm, Patient Position: Sitting, Cuff Size: Normal)   Pulse 77   Ht 5\' 7"  (1.702 m)   Wt 176 lb (79.8 kg)   SpO2 96%   BMI 27.57 kg/m   Assessment and Plan: 1. Irritant contact dermatitis due to plants, except food Continue antihistamine as needed - predniSONE (DELTASONE) 10 MG tablet; Take 6 on day 1, 5 on day 2, 4 on day 3, 3 on day 4, 2 on day 5 and 1 on day 1 then stop.  Dispense: 21 tablet; Refill: 0  Meds ordered this encounter  Medications  . predniSONE (DELTASONE) 10 MG tablet    Sig: Take 6 on day 1, 5 on day 2, 4 on day 3, 3 on day 4, 2 on day 5 and 1 on day 1 then stop.    Dispense:  21 tablet    Refill:  0    Partially dictated using Editor, commissioning. Any errors are unintentional.  Halina Maidens, MD [facility]  11/24/2016

## 2016-12-27 ENCOUNTER — Ambulatory Visit: Payer: Self-pay

## 2016-12-30 ENCOUNTER — Other Ambulatory Visit: Payer: Self-pay

## 2016-12-30 ENCOUNTER — Ambulatory Visit (INDEPENDENT_AMBULATORY_CARE_PROVIDER_SITE_OTHER): Payer: Medicare Other

## 2016-12-30 VITALS — BP 110/70 | HR 70 | Temp 98.3°F | Resp 16 | Ht 67.0 in | Wt 177.8 lb

## 2016-12-30 DIAGNOSIS — Z0000 Encounter for general adult medical examination without abnormal findings: Secondary | ICD-10-CM

## 2016-12-30 DIAGNOSIS — Z23 Encounter for immunization: Secondary | ICD-10-CM

## 2016-12-30 DIAGNOSIS — Z1239 Encounter for other screening for malignant neoplasm of breast: Secondary | ICD-10-CM

## 2016-12-30 NOTE — Patient Instructions (Signed)
Dana Cameron , Thank you for taking time to come for your Medicare Wellness Visit. I appreciate your ongoing commitment to your health goals. Please review the following plan we discussed and let me know if I can assist you in the future.   Screening recommendations/referrals: Colonoscopy: Declined today. Will continue with Hemoccults and discuss with Dr. Army Melia at next office visit Mammogram: Completed 02/03/16. Repeat mammograms annually Bone Density: Completed 12/25/13 Recommended yearly ophthalmology/optometry visit for glaucoma screening and checkup Recommended yearly dental visit for hygiene and checkup  Vaccinations: Influenza vaccine: Given today Pneumococcal vaccine: Completed series Tdap vaccine: Up to date Shingles vaccine: Completed 06/22/10  Advanced directives: Please bring a copy of your POA (Power of Viola) and/or Living Will to your next appointment.   Conditions/risks identified: Fall risk prevention discussed;   Next appointment: You are scheduled to see Dr. Army Melia on 01/26/17 @ 8:30am.   Please schedule your annual wellness exam with the Nurse Health Advisor in one year.   Preventive Care 38 Years and Older, Female Preventive care refers to lifestyle choices and visits with your health care provider that can promote health and wellness. What does preventive care include?  A yearly physical exam. This is also called an annual well check.  Dental exams once or twice a year.  Routine eye exams. Ask your health care provider how often you should have your eyes checked.  Personal lifestyle choices, including:  Daily care of your teeth and gums.  Regular physical activity.  Eating a healthy diet.  Avoiding tobacco and drug use.  Limiting alcohol use.  Practicing safe sex.  Taking low-dose aspirin every day.  Taking vitamin and mineral supplements as recommended by your health care provider. What happens during an annual well check? The services  and screenings done by your health care provider during your annual well check will depend on your age, overall health, lifestyle risk factors, and family history of disease. Counseling  Your health care provider may ask you questions about your:  Alcohol use.  Tobacco use.  Drug use.  Emotional well-being.  Home and relationship well-being.  Sexual activity.  Eating habits.  History of falls.  Memory and ability to understand (cognition).  Work and work Statistician.  Reproductive health. Screening  You may have the following tests or measurements:  Height, weight, and BMI.  Blood pressure.  Lipid and cholesterol levels. These may be checked every 5 years, or more frequently if you are over 5 years old.  Skin check.  Lung cancer screening. You may have this screening every year starting at age 34 if you have a 30-pack-year history of smoking and currently smoke or have quit within the past 15 years.  Fecal occult blood test (FOBT) of the stool. You may have this test every year starting at age 59.  Flexible sigmoidoscopy or colonoscopy. You may have a sigmoidoscopy every 5 years or a colonoscopy every 10 years starting at age 87.  Hepatitis C blood test.  Hepatitis B blood test.  Sexually transmitted disease (STD) testing.  Diabetes screening. This is done by checking your blood sugar (glucose) after you have not eaten for a while (fasting). You may have this done every 1-3 years.  Bone density scan. This is done to screen for osteoporosis. You may have this done starting at age 47.  Mammogram. This may be done every 1-2 years. Talk to your health care provider about how often you should have regular mammograms. Talk with your health care provider  about your test results, treatment options, and if necessary, the need for more tests. Vaccines  Your health care provider may recommend certain vaccines, such as:  Influenza vaccine. This is recommended every  year.  Tetanus, diphtheria, and acellular pertussis (Tdap, Td) vaccine. You may need a Td booster every 10 years.  Zoster vaccine. You may need this after age 58.  Pneumococcal 13-valent conjugate (PCV13) vaccine. One dose is recommended after age 75.  Pneumococcal polysaccharide (PPSV23) vaccine. One dose is recommended after age 67. Talk to your health care provider about which screenings and vaccines you need and how often you need them. This information is not intended to replace advice given to you by your health care provider. Make sure you discuss any questions you have with your health care provider. Document Released: 03/14/2015 Document Revised: 11/05/2015 Document Reviewed: 12/17/2014 Elsevier Interactive Patient Education  2017 [employer] Prevention in the Home Falls can cause injuries. They can happen to people of all ages. There are many things you can do to make your home safe and to help prevent falls. What can I do on the outside of my home?  Regularly fix the edges of walkways and driveways and fix any cracks.  Remove anything that might make you trip as you walk through a door, such as a raised step or threshold.  Trim any bushes or trees on the path to your home.  Use bright outdoor lighting.  Clear any walking paths of anything that might make someone trip, such as rocks or tools.  Regularly check to see if handrails are loose or broken. Make sure that both sides of any steps have handrails.  Any raised decks and porches should have guardrails on the edges.  Have any leaves, snow, or ice cleared regularly.  Use sand or salt on walking paths during winter.  Clean up any spills in your garage right away. This includes oil or grease spills. What can I do in the bathroom?  Use night lights.  Install grab bars by the toilet and in the tub and shower. Do not use towel bars as grab bars.  Use non-skid mats or decals in the tub or shower.  If you  need to sit down in the shower, use a plastic, non-slip stool.  Keep the floor dry. Clean up any water that spills on the floor as soon as it happens.  Remove soap buildup in the tub or shower regularly.  Attach bath mats securely with double-sided non-slip rug tape.  Do not have throw rugs and other things on the floor that can make you trip. What can I do in the bedroom?  Use night lights.  Make sure that you have a light by your bed that is easy to reach.  Do not use any sheets or blankets that are too big for your bed. They should not hang down onto the floor.  Have a firm chair that has side arms. You can use this for support while you get dressed.  Do not have throw rugs and other things on the floor that can make you trip. What can I do in the kitchen?  Clean up any spills right away.  Avoid walking on wet floors.  Keep items that you use a lot in easy-to-reach places.  If you need to reach something above you, use a strong step stool that has a grab bar.  Keep electrical cords out of the way.  Do not use floor polish or  wax that makes floors slippery. If you must use wax, use non-skid floor wax.  Do not have throw rugs and other things on the floor that can make you trip. What can I do with my stairs?  Do not leave any items on the stairs.  Make sure that there are handrails on both sides of the stairs and use them. Fix handrails that are broken or loose. Make sure that handrails are as long as the stairways.  Check any carpeting to make sure that it is firmly attached to the stairs. Fix any carpet that is loose or worn.  Avoid having throw rugs at the top or bottom of the stairs. If you do have throw rugs, attach them to the floor with carpet tape.  Make sure that you have a light switch at the top of the stairs and the bottom of the stairs. If you do not have them, ask someone to add them for you. What else can I do to help prevent falls?  Wear shoes  that:  Do not have high heels.  Have rubber bottoms.  Are comfortable and fit you well.  Are closed at the toe. Do not wear sandals.  If you use a stepladder:  Make sure that it is fully opened. Do not climb a closed stepladder.  Make sure that both sides of the stepladder are locked into place.  Ask someone to hold it for you, if possible.  Clearly mark and make sure that you can see:  Any grab bars or handrails.  First and last steps.  Where the edge of each step is.  Use tools that help you move around (mobility aids) if they are needed. These include:  Canes.  Walkers.  Scooters.  Crutches.  Turn on the lights when you go into a dark area. Replace any light bulbs as soon as they burn out.  Set up your furniture so you have a clear path. Avoid moving your furniture around.  If any of your floors are uneven, fix them.  If there are any pets around you, be aware of where they are.  Review your medicines with your doctor. Some medicines can make you feel dizzy. This can increase your chance of falling. Ask your doctor what other things that you can do to help prevent falls. This information is not intended to replace advice given to you by your health care provider. Make sure you discuss any questions you have with your health care provider. Document Released: 12/12/2008 Document Revised: 07/24/2015 Document Reviewed: 03/22/2014 Elsevier Interactive Patient Education  2017 [employer].

## 2016-12-30 NOTE — Progress Notes (Signed)
Subjective:   Dana Cameron is a 71 y.o. female who presents for Medicare Annual (Subsequent) preventive examination.  Review of Systems:  N/A Cardiac Risk Factors include: advanced age (>91men, >40 women);dyslipidemia     Objective:     Vitals: BP 110/70 (BP Location: Right Arm, Patient Position: Sitting, Cuff Size: Normal)   Pulse 70   Temp 98.3 F (36.8 C) (Oral)   Resp 16   Ht 5\' 7"  (1.702 m)   Wt 177 lb 12.8 oz (80.6 kg)   BMI 27.85 kg/m   Body mass index is 27.85 kg/m.   Tobacco History  Smoking Status  . Never Smoker  Smokeless Tobacco  . Never Used     Counseling given: Not Answered   Past Medical History:  Diagnosis Date  . Hyperlipidemia    Past Surgical History:  Procedure Laterality Date  . BREAST BIOPSY Left 1993   neg  . EXCISION VAGINAL CYST     Family History  Problem Relation Age of Onset  . Transient ischemic attack Mother   . Hypertension Father   . Stroke Father   . Asthma Sister   . Lung cancer Brother   . Breast cancer Neg Hx    History  Sexual Activity  . Sexual activity: Not on file    Outpatient Encounter Prescriptions as of 12/30/2016  Medication Sig  . cetirizine (ZYRTEC) 10 MG tablet Take 10 mg by mouth daily.  . Cholecalciferol (VITAMIN D3) 5000 UNITS CAPS Take 1 capsule by mouth daily.  . Estradiol-Estriol-Progesterone (BIEST/PROGESTERONE) CREA Place 1 mL onto the skin daily. 80/20 strength  . fluticasone (FLONASE) 50 MCG/ACT nasal spray USE TWO SPRAY(S) IN EACH NOSTRIL ONCE DAILY  . NONFORMULARY OR COMPOUNDED ITEM Progesterone 3.5% HRT - apply 1-2 ml topically daily  . Omega 3 1000 MG CAPS Take 1 capsule by mouth daily.  . [DISCONTINUED] predniSONE (DELTASONE) 10 MG tablet Take 6 on day 1, 5 on day 2, 4 on day 3, 3 on day 4, 2 on day 5 and 1 on day 1 then stop.   No facility-administered encounter medications on file as of 12/30/2016.     Activities of Daily Living In your present state of health, do you  have any difficulty performing the following activities: 12/30/2016 01/26/2016  Hearing? N N  Vision? N N  Difficulty concentrating or making decisions? N N  Walking or climbing stairs? N N  Dressing or bathing? N N  Doing errands, shopping? N N  Preparing Food and eating ? N N  Using the Toilet? N N  In the past six months, have you accidently leaked urine? N N  Do you have problems with loss of bowel control? N N  Managing your Medications? N N  Managing your Finances? N N  Housekeeping or managing your Housekeeping? N N  Some recent data might be hidden    Patient Care Team: Glean Hess, MD as PCP - General (Family Medicine)    Assessment:     Exercise Activities and Dietary recommendations Current Exercise Habits: The patient does not participate in regular exercise at present, Exercise limited by: None identified  Goals    . Exercise 150 minutes per week (moderate activity) (pt-stated)          Pt states she would like to walk or do some strengthening exercise, 150 minutes per week.    . Increase physical activity      Fall Risk Fall Risk  12/30/2016 01/26/2016 01/21/2015  Falls in the past year? No No No   Depression Screen PHQ 2/9 Scores 12/30/2016 01/26/2016 01/21/2015  PHQ - 2 Score 0 0 0     Cognitive Function     6CIT Screen 12/30/2016 01/26/2016  What Year? 0 points 0 points  What month? 0 points 0 points  What time? 0 points 0 points  Count back from 20 0 points 0 points  Months in reverse 0 points 0 points  Repeat phrase 0 points 0 points  Total Score 0 0    Immunization History  Administered Date(s) Administered  . Influenza, High Dose Seasonal PF 12/30/2016  . Influenza,inj,Quad PF,6+ Mos 11/21/2014, 01/26/2016  . Pneumococcal Conjugate-13 01/21/2015  . Pneumococcal Polysaccharide-23 08/24/2012  . Tdap 08/25/2009  . Zoster 06/22/2010   Screening Tests Health Maintenance  Topic Date Due  . INFLUENZA VACCINE  09/29/2016  .  COLONOSCOPY  03/01/2018 (Originally 03/02/2013)  . MAMMOGRAM  02/02/2017  . TETANUS/TDAP  08/26/2019  . DEXA SCAN  Addressed  . Hepatitis C Screening  Addressed  . PNA vac Low Risk Adult  Completed      Plan:    I have personally reviewed and addressed the Medicare Annual Wellness questionnaire and have noted the following in the patient's chart:  A. Medical and social history B. Use of alcohol, tobacco or illicit drugs  C. Current medications and supplements D. Functional ability and status E.  Nutritional status F.  Physical activity G. Advance directives H. List of other physicians I.  Hospitalizations, surgeries, and ER visits in previous 12 months J.  [place] such as hearing and vision if needed, cognitive and depression L. Referrals and appointments - none  In addition, I have reviewed and discussed with patient certain preventive protocols, quality metrics, and best practice recommendations. A written personalized care plan for preventive services as well as general preventive health recommendations were provided to patient.  See attached scanned questionnaire for additional information.   Signed,  Aleatha Borer, LPN Nurse Health Advisor  MD Recommendations: Flu vaccine given today.  Pt declined to order Colonoscopy today. States she would like to continue hemoccult testing as you both have previously discussed.  Mammogram will be due on or after 02/02/17. Advised pt this exam will be ordered closer to this date.

## 2016-12-30 NOTE — Progress Notes (Signed)
Screening mammogram due 02/02/17. Pt scheduled to follow up with Dr. Army Melia on 01/26/17. After further discussion with Dr. Army Melia, order placed and a reminder will be given to the pt during her follow up appt.

## 2017-01-26 ENCOUNTER — Ambulatory Visit (INDEPENDENT_AMBULATORY_CARE_PROVIDER_SITE_OTHER): Payer: Medicare Other | Admitting: Internal Medicine

## 2017-01-26 ENCOUNTER — Encounter: Payer: Self-pay | Admitting: Internal Medicine

## 2017-01-26 VITALS — BP 132/84 | HR 66 | Ht 67.0 in | Wt 179.0 lb

## 2017-01-26 DIAGNOSIS — J3089 Other allergic rhinitis: Secondary | ICD-10-CM | POA: Diagnosis not present

## 2017-01-26 DIAGNOSIS — E785 Hyperlipidemia, unspecified: Secondary | ICD-10-CM

## 2017-01-26 DIAGNOSIS — Z1211 Encounter for screening for malignant neoplasm of colon: Secondary | ICD-10-CM

## 2017-01-26 DIAGNOSIS — Z0000 Encounter for general adult medical examination without abnormal findings: Secondary | ICD-10-CM | POA: Diagnosis not present

## 2017-01-26 DIAGNOSIS — N951 Menopausal and female climacteric states: Secondary | ICD-10-CM

## 2017-01-26 DIAGNOSIS — M65312 Trigger thumb, left thumb: Secondary | ICD-10-CM

## 2017-01-26 DIAGNOSIS — Z1231 Encounter for screening mammogram for malignant neoplasm of breast: Secondary | ICD-10-CM

## 2017-01-26 DIAGNOSIS — Z23 Encounter for immunization: Secondary | ICD-10-CM

## 2017-01-26 LAB — POCT URINALYSIS DIPSTICK
BILIRUBIN UA: NEGATIVE
Blood, UA: NEGATIVE
GLUCOSE UA: NEGATIVE
Ketones, UA: NEGATIVE
LEUKOCYTES UA: NEGATIVE
NITRITE UA: NEGATIVE
PH UA: 6 (ref 5.0–8.0)
Protein, UA: NEGATIVE
Spec Grav, UA: 1.02 (ref 1.010–1.025)
Urobilinogen, UA: 0.2 E.U./dL

## 2017-01-26 MED ORDER — ZOSTER VAC RECOMB ADJUVANTED 50 MCG/0.5ML IM SUSR: 0.5000 mL | Freq: Once | INTRAMUSCULAR | 1 refills | Status: AC

## 2017-01-26 NOTE — Progress Notes (Signed)
Date:  01/26/2017   Name:  Dana Cameron   DOB:  03-Aug-1945   MRN:  [id number]   Chief Complaint: Annual Exam (Breast Exam. ) Dana Cameron is a 71 y.o. female who presents today for her Complete Annual Exam. She feels fairly well. She reports exercising walking. She reports she is sleeping well. No breast problems.  She is due for a mammogram.  She is also overdue for colonoscopy. She has been hesitant to repeat this but is agreeable today.  Hand Pain   The incident occurred more than 1 week ago. There was no injury mechanism. The pain is present in the left hand (triggering of thumb and thickening of palmar fascia). Pertinent negatives include no chest pain.  Hip Pain   There was no injury mechanism. The pain is present in the right hip. The quality of the pain is described as cramping and aching. The pain is moderate. The pain has been worsening since onset. She has tried NSAIDs (chiropractic) for the symptoms. The treatment provided moderate relief.  HRT - on topical HRT and doing well.  Not having hot flashes or sweats.  Sleep is good.  No mood changes. Seasonal allergies - controlled with Flonase and Zyrtec   Review of Systems  Constitutional: Negative for chills, fatigue and fever.  HENT: Negative for congestion, hearing loss, tinnitus, trouble swallowing and voice change.   Eyes: Negative for visual disturbance.  Respiratory: Negative for cough, chest tightness, shortness of breath and wheezing.   Cardiovascular: Negative for chest pain, palpitations and leg swelling.  Gastrointestinal: Positive for constipation. Negative for abdominal pain, diarrhea and vomiting.  Endocrine: Negative for polydipsia and polyuria.  Genitourinary: Negative for dysuria, frequency, genital sores, vaginal bleeding and vaginal discharge.  Musculoskeletal: Positive for arthralgias. Negative for gait problem and joint swelling.  Skin: Negative for color change and rash.  Neurological:  Negative for dizziness, tremors, light-headedness and headaches.  Hematological: Negative for adenopathy. Does not bruise/bleed easily.  Psychiatric/Behavioral: Negative for dysphoric mood and sleep disturbance. The patient is not nervous/anxious.     Patient Active Problem List   Diagnosis Date Noted  . Arthritis of right hip 01/26/2016  . Hyperlipidemia, mild 03/28/2015  . Constipation 03/28/2015  . Fibrocystic breast 09/17/2014  . Environmental and seasonal allergies 09/17/2014  . 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 Gonalgia 09/17/2014  . Hot flash, menopausal 09/17/2014    Prior to Admission medications   Medication Sig Start Date End Date Taking? Authorizing Provider  cetirizine (ZYRTEC) 10 MG tablet Take 10 mg by mouth daily.   Yes [provider]  Cholecalciferol (VITAMIN D3) 5000 UNITS CAPS Take 1 capsule by mouth daily.   Yes [provider]  Estradiol-Estriol-Progesterone (BIEST/PROGESTERONE) CREA Place 1 mL onto the skin daily. 80/20 strength 09/15/16  Yes Glean Hess, MD  fluticasone [facility]) 50 MCG/ACT nasal spray USE TWO SPRAY(S) IN EACH NOSTRIL ONCE DAILY 09/17/14  Yes Glean Hess, MD  NONFORMULARY OR COMPOUNDED ITEM Progesterone 3.5% HRT - apply 1-2 ml topically daily 09/15/16  Yes Glean Hess, MD  Omega 3 1000 MG CAPS Take 1 capsule by mouth daily.   Yes [provider]    No Known Allergies  Past Surgical History:  Procedure Laterality Date  . BREAST BIOPSY Left 1993   neg  . EXCISION VAGINAL CYST      Social History   Tobacco Use  . Smoking status: Never Smoker  . Smokeless tobacco: Never Used  Substance  Use Topics  . Alcohol use: No    Alcohol/week: 0.0 oz  . Drug use: No     Medication list has been reviewed and updated.  PHQ 2/9 Scores 12/30/2016 01/26/2016 01/21/2015  PHQ - 2 Score 0 0 0    Physical Exam  Constitutional: She is oriented to person, place, and time. She appears well-developed and  well-nourished. No distress.  HENT:  Head: Normocephalic and atraumatic.  Right Ear: Tympanic membrane and ear canal normal.  Left Ear: Tympanic membrane and ear canal normal.  Nose: Right sinus exhibits no maxillary sinus tenderness. Left sinus exhibits no maxillary sinus tenderness.  Mouth/Throat: Uvula is midline and oropharynx is clear and moist.  Eyes: Conjunctivae and EOM are normal. Right eye exhibits no discharge. Left eye exhibits no discharge. No scleral icterus.  Neck: Normal range of motion. Carotid bruit is not present. No erythema present. No thyromegaly present.  Cardiovascular: Normal rate, regular rhythm, normal heart sounds and normal pulses.  Pulmonary/Chest: Effort normal. No respiratory distress. She has no wheezes. Right breast exhibits no mass, no nipple discharge, no skin change and no tenderness. Left breast exhibits no mass, no nipple discharge, no skin change and no tenderness.  Abdominal: Soft. Bowel sounds are normal. There is no hepatosplenomegaly. There is no tenderness. There is no CVA tenderness.  Musculoskeletal: Normal range of motion.  Triggering of left thumb  Tender nodules on left palm at base of middle and ring fingers  Lymphadenopathy:    She has no cervical adenopathy.    She has no axillary adenopathy.  Neurological: She is alert and oriented to person, place, and time. She has normal reflexes. No cranial nerve deficit or sensory deficit.  Skin: Skin is warm, dry and intact. No rash noted.  Psychiatric: She has a normal mood and affect. Her speech is normal and behavior is normal. Thought content normal.  Nursing note and vitals reviewed.   BP 132/84   Pulse 66   Ht 5\' 7"  (1.702 m)   Wt 179 lb (81.2 kg)   SpO2 98%   BMI 28.04 kg/m   Assessment and Plan: 1. Annual physical exam - CBC with Differential/Platelet - Comprehensive metabolic panel - TSH - POCT urinalysis dipstick  2. Encounter for screening mammogram for breast cancer To be  scheduled by patient at [facility]  3. Environmental and seasonal allergies controlled  4. Colon cancer screening Referred to Dr. Allen Norris - Ambulatory referral to Gastroenterology  5. Hot flash, menopausal Continue topical HRT  6. Hyperlipidemia, mild Continue diet and exercise - Lipid panel  7. Need for shingles vaccine - Zoster Vaccine Adjuvanted [facility]) injection; Inject 0.5 mLs into the muscle once for 1 dose.  Dispense: 0.5 mL; Refill: 1  8. Trigger finger of left thumb Pt does not desire referral at this time   Meds ordered this encounter  Medications  . Zoster Vaccine Adjuvanted [facility]) injection    Sig: Inject 0.5 mLs into the muscle once for 1 dose.    Dispense:  0.5 mL    Refill:  1    Partially dictated using Editor, commissioning. Any errors are unintentional.  Halina Maidens, MD [facility]  01/26/2017

## 2017-01-27 LAB — CBC WITH DIFFERENTIAL/PLATELET
Basophils Absolute: 0 10*3/uL (ref 0.0–0.2)
Basos: 1 %
EOS (ABSOLUTE): 0.1 10*3/uL (ref 0.0–0.4)
EOS: 1 %
HEMATOCRIT: 43.2 % (ref 34.0–46.6)
HEMOGLOBIN: 14.6 g/dL (ref 11.1–15.9)
IMMATURE GRANULOCYTES: 0 %
Immature Grans (Abs): 0 10*3/uL (ref 0.0–0.1)
LYMPHS ABS: 1.8 10*3/uL (ref 0.7–3.1)
Lymphs: 39 %
MCH: 31 pg (ref 26.6–33.0)
MCHC: 33.8 g/dL (ref 31.5–35.7)
MCV: 92 fL (ref 79–97)
Monocytes Absolute: 0.5 10*3/uL (ref 0.1–0.9)
Monocytes: 11 %
Neutrophils Absolute: 2.3 10*3/uL (ref 1.4–7.0)
Neutrophils: 48 %
Platelets: 298 10*3/uL (ref 150–379)
RBC: 4.71 x10E6/uL (ref 3.77–5.28)
RDW: 14 % (ref 12.3–15.4)
WBC: 4.7 10*3/uL (ref 3.4–10.8)

## 2017-01-27 LAB — COMPREHENSIVE METABOLIC PANEL
ALBUMIN: 4.4 g/dL (ref 3.5–4.8)
ALT: 15 IU/L (ref 0–32)
AST: 22 IU/L (ref 0–40)
Albumin/Globulin Ratio: 2.2 (ref 1.2–2.2)
Alkaline Phosphatase: 93 IU/L (ref 39–117)
BUN / CREAT RATIO: 15 (ref 12–28)
BUN: 13 mg/dL (ref 8–27)
Bilirubin Total: 0.4 mg/dL (ref 0.0–1.2)
CALCIUM: 9 mg/dL (ref 8.7–10.3)
CO2: 24 mmol/L (ref 20–29)
CREATININE: 0.84 mg/dL (ref 0.57–1.00)
Chloride: 104 mmol/L (ref 96–106)
GFR calc Af Amer: 81 mL/min/{1.73_m2} (ref >59)
GFR, EST NON AFRICAN AMERICAN: 71 mL/min/{1.73_m2} (ref >59)
GLOBULIN, TOTAL: 2 g/dL (ref 1.5–4.5)
Glucose: 81 mg/dL (ref 65–99)
Potassium: 4.4 mmol/L (ref 3.5–5.2)
SODIUM: 142 mmol/L (ref 134–144)
Total Protein: 6.4 g/dL (ref 6.0–8.5)

## 2017-01-27 LAB — LIPID PANEL
CHOL/HDL RATIO: 2.8 ratio (ref 0.0–4.4)
Cholesterol, Total: 148 mg/dL (ref 100–199)
HDL: 52 mg/dL (ref >39)
LDL CALC: 65 mg/dL (ref 0–99)
Triglycerides: 154 mg/dL — ABNORMAL HIGH (ref 0–149)
VLDL CHOLESTEROL CAL: 31 mg/dL (ref 5–40)

## 2017-01-27 LAB — TSH: TSH: 3.15 u[IU]/mL (ref 0.450–4.500)

## 2017-01-28 ENCOUNTER — Encounter: Payer: Self-pay | Admitting: Internal Medicine

## 2017-01-28 DIAGNOSIS — K635 Polyp of colon: Secondary | ICD-10-CM | POA: Insufficient documentation

## 2017-02-10 ENCOUNTER — Ambulatory Visit
Admission: RE | Admit: 2017-02-10 | Discharge: 2017-02-10 | Disposition: A | Discharge status: Home / Self Care | Payer: Medicare Other | Source: Ambulatory Visit | Attending: Internal Medicine | Admitting: Internal Medicine

## 2017-02-10 DIAGNOSIS — Z1239 Encounter for other screening for malignant neoplasm of breast: Secondary | ICD-10-CM

## 2017-02-10 DIAGNOSIS — R928 Other abnormal and inconclusive findings on diagnostic imaging of breast: Secondary | ICD-10-CM | POA: Diagnosis not present

## 2017-02-10 DIAGNOSIS — Z1231 Encounter for screening mammogram for malignant neoplasm of breast: Secondary | ICD-10-CM | POA: Diagnosis not present

## 2017-02-11 ENCOUNTER — Other Ambulatory Visit: Payer: Self-pay | Admitting: Internal Medicine

## 2017-02-11 DIAGNOSIS — N6489 Other specified disorders of breast: Secondary | ICD-10-CM

## 2017-02-11 DIAGNOSIS — R928 Other abnormal and inconclusive findings on diagnostic imaging of breast: Secondary | ICD-10-CM

## 2017-02-23 ENCOUNTER — Ambulatory Visit
Admission: RE | Admit: 2017-02-23 | Discharge: 2017-02-23 | Disposition: A | Discharge status: Home / Self Care | Payer: Medicare Other | Source: Ambulatory Visit | Attending: Internal Medicine | Admitting: Internal Medicine

## 2017-02-23 DIAGNOSIS — N6489 Other specified disorders of breast: Secondary | ICD-10-CM | POA: Diagnosis not present

## 2017-02-23 DIAGNOSIS — R928 Other abnormal and inconclusive findings on diagnostic imaging of breast: Secondary | ICD-10-CM | POA: Diagnosis not present

## 2017-02-25 DIAGNOSIS — M65312 Trigger thumb, left thumb: Secondary | ICD-10-CM | POA: Insufficient documentation

## 2017-03-16 ENCOUNTER — Other Ambulatory Visit: Payer: Self-pay

## 2017-03-16 DIAGNOSIS — Z1211 Encounter for screening for malignant neoplasm of colon: Secondary | ICD-10-CM

## 2017-04-27 ENCOUNTER — Other Ambulatory Visit: Payer: Self-pay

## 2017-04-27 ENCOUNTER — Telehealth: Payer: Self-pay

## 2017-04-27 NOTE — Telephone Encounter (Signed)
Patient called to get refills on hormone replacement cream. [facility] closed so she needs sent to [facility] in [place] since speciality drugs.   Please Advise.

## 2017-04-27 NOTE — Telephone Encounter (Signed)
The compounding pharmacy will need to send me the prescription by fax.

## 2017-04-28 NOTE — Telephone Encounter (Signed)
Patient informed. 

## 2017-04-29 ENCOUNTER — Other Ambulatory Visit: Payer: Self-pay | Admitting: Internal Medicine

## 2017-05-12 DIAGNOSIS — D229 Melanocytic nevi, unspecified: Secondary | ICD-10-CM | POA: Diagnosis not present

## 2017-05-12 DIAGNOSIS — L821 Other seborrheic keratosis: Secondary | ICD-10-CM | POA: Diagnosis not present

## 2017-05-12 DIAGNOSIS — L820 Inflamed seborrheic keratosis: Secondary | ICD-10-CM | POA: Diagnosis not present

## 2017-05-12 DIAGNOSIS — L578 Other skin changes due to chronic exposure to nonionizing radiation: Secondary | ICD-10-CM | POA: Diagnosis not present

## 2017-06-08 DIAGNOSIS — M5033 Other cervical disc degeneration, cervicothoracic region: Secondary | ICD-10-CM | POA: Diagnosis not present

## 2017-06-08 DIAGNOSIS — M9902 Segmental and somatic dysfunction of thoracic region: Secondary | ICD-10-CM | POA: Diagnosis not present

## 2017-06-08 DIAGNOSIS — M62830 Muscle spasm of back: Secondary | ICD-10-CM | POA: Diagnosis not present

## 2017-06-08 DIAGNOSIS — M9901 Segmental and somatic dysfunction of cervical region: Secondary | ICD-10-CM | POA: Diagnosis not present

## 2017-06-15 ENCOUNTER — Ambulatory Visit (INDEPENDENT_AMBULATORY_CARE_PROVIDER_SITE_OTHER): Payer: Medicare HMO | Admitting: Internal Medicine

## 2017-06-15 ENCOUNTER — Encounter: Payer: Self-pay | Admitting: Internal Medicine

## 2017-06-15 VITALS — BP 112/74 | HR 57 | Ht 67.0 in | Wt 179.0 lb

## 2017-06-15 DIAGNOSIS — M5432 Sciatica, left side: Secondary | ICD-10-CM | POA: Diagnosis not present

## 2017-06-15 DIAGNOSIS — M7072 Other bursitis of hip, left hip: Secondary | ICD-10-CM

## 2017-06-15 MED ORDER — PREDNISONE 10 MG PO TABS: ORAL_TABLET | ORAL | 0 refills | Status: DC

## 2017-06-15 NOTE — Patient Instructions (Signed)
Ice or heat - 20 minutes three times a day to posterior and lateral hip.

## 2017-06-15 NOTE — Progress Notes (Signed)
Date:  06/15/2017   Name:  Dana Cameron   DOB:  [birth year]   MRN:  [id number]   Chief Complaint: Knee Pain (L Knee Swelling and Pain. Started 2 weeks ago and progressively getting worse. Pain is in back thigh radiating into knee and down into foot at times. Knee is swollen. No injury to leg or knee. Sitting for long periods causes pain to be worse and can hardly stand.) Knee Pain   The incident occurred more than 1 week ago (2 weeks ago). There was no injury mechanism. The pain is present in the left knee. The quality of the pain is described as aching. The pain is moderate. Pertinent negatives include no numbness.   She described left sciatic pain and lateral hip pain radiating to left lateral knee and posterior thigh. She was in [facility] and rode a bicycle for the first time in a while - the seat was too low and she struggled to peddle.   Review of Systems  Constitutional: Negative for chills, fatigue and fever.  Respiratory: Negative for chest tightness and shortness of breath.   Cardiovascular: Negative for chest pain, palpitations and leg swelling.  Genitourinary: Negative for difficulty urinating.  Musculoskeletal: Positive for arthralgias and gait problem. Negative for joint swelling and myalgias.  Neurological: Negative for dizziness, tremors and numbness.    Patient Active Problem List   Diagnosis Date Noted  . Colon polyp, hyperplastic 01/28/2017  . Arthritis of right hip 01/26/2016  . Hyperlipidemia, mild 03/28/2015  . Constipation 03/28/2015  . Fibrocystic breast 09/17/2014  . Environmental and seasonal allergies 09/17/2014  . 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 Gonalgia 09/17/2014  . Hot flash, menopausal 09/17/2014    Prior to Admission medications   Medication Sig Start Date End Date Taking? Authorizing Provider  cetirizine (ZYRTEC) 10 MG tablet Take 10 mg by mouth daily.    [provider]  Cholecalciferol (VITAMIN D3) 5000 UNITS CAPS Take 1 capsule by  mouth daily.    [provider]  Estradiol-Estriol-Progesterone (BIEST/PROGESTERONE) CREA Place 1 mL onto the skin daily. 80/20 strength 09/15/16   Glean Hess, MD  fluticasone [facility]) 50 MCG/ACT nasal spray USE TWO SPRAY(S) IN EACH NOSTRIL ONCE DAILY 09/17/14   Glean Hess, MD  NONFORMULARY OR COMPOUNDED ITEM Progesterone 3.5% HRT - apply 1-2 ml topically daily 09/15/16   Glean Hess, MD  Omega 3 1000 MG CAPS Take 1 capsule by mouth daily.    [provider]    No Known Allergies  Past Surgical History:  Procedure Laterality Date  . BREAST BIOPSY Left 1993   neg  . COLONOSCOPY  12/31/2003   one hyperplastic polyp  . EXCISION VAGINAL CYST      Social History   Tobacco Use  . Smoking status: Never Smoker  . Smokeless tobacco: Never Used  Substance Use Topics  . Alcohol use: No    Alcohol/week: 0.0 oz  . Drug use: No     Medication list has been reviewed and updated.  PHQ 2/9 Scores 12/30/2016 01/26/2016 01/21/2015  PHQ - 2 Score 0 0 0    Physical Exam  Constitutional: She is oriented to person, place, and time. She appears well-developed. No distress.  HENT:  Head: Normocephalic and atraumatic.  Pulmonary/Chest: Effort normal. No respiratory distress.  Musculoskeletal: Normal range of motion.       Left hip: She exhibits tenderness (over lateral bursa).       Right knee: Normal.  Left knee: Normal. She exhibits normal range of motion and no effusion. No tenderness found.       Lumbar back: She exhibits normal range of motion, no tenderness and no spasm.  No calf swelling or edema SLR positive on left  Neurological: She is alert and oriented to person, place, and time. She has normal strength. No sensory deficit.  Reflex Scores:      Patellar reflexes are 1+ on the right side and 1+ on the left side. Skin: Skin is warm and dry. No rash noted.  Psychiatric: She has a normal mood and affect. Her behavior is normal. Thought content  normal.    BP 112/74   Pulse (!) 57   Ht 5\' 7"  (1.702 m)   Wt 179 lb (81.2 kg)   SpO2 99%   BMI 28.04 kg/m   Assessment and Plan: 1. Bursitis of left hip, unspecified bursa Use ice or heat three times per day - predniSONE (DELTASONE) 10 MG tablet; Take 6 on day 1, 5 on day 2, 4 on day 3, 3 on day 4, 2 on day 5 and 1 on day 1 then stop.  Dispense: 21 tablet; Refill: 0  2. Sciatica of left side Prednisone should be helpful If sx persist - return or call   Meds ordered this encounter  Medications  . predniSONE (DELTASONE) 10 MG tablet    Sig: Take 6 on day 1, 5 on day 2, 4 on day 3, 3 on day 4, 2 on day 5 and 1 on day 1 then stop.    Dispense:  21 tablet    Refill:  0    Partially dictated using Editor, commissioning. Any errors are unintentional.  Halina Maidens, MD [facility]  06/15/2017

## 2017-06-20 ENCOUNTER — Other Ambulatory Visit: Payer: Self-pay

## 2017-06-20 MED ORDER — NA SULFATE-K SULFATE-MG SULF 17.5-3.13-1.6 GM/177ML PO SOLN: 1.0000 | ORAL | 0 refills | Status: DC

## 2017-06-22 DIAGNOSIS — M25562 Pain in left knee: Secondary | ICD-10-CM | POA: Diagnosis not present

## 2017-06-23 DIAGNOSIS — R69 Illness, unspecified: Secondary | ICD-10-CM | POA: Diagnosis not present

## 2017-06-29 DIAGNOSIS — M25562 Pain in left knee: Secondary | ICD-10-CM | POA: Diagnosis not present

## 2017-06-30 ENCOUNTER — Ambulatory Visit: Admit: 2017-06-30 | Payer: Medicare Other | Admitting: Gastroenterology

## 2017-06-30 SURGERY — COLONOSCOPY WITH PROPOFOL
Anesthesia: Choice

## 2017-07-06 DIAGNOSIS — M9902 Segmental and somatic dysfunction of thoracic region: Secondary | ICD-10-CM | POA: Diagnosis not present

## 2017-07-06 DIAGNOSIS — M5033 Other cervical disc degeneration, cervicothoracic region: Secondary | ICD-10-CM | POA: Diagnosis not present

## 2017-07-06 DIAGNOSIS — M9901 Segmental and somatic dysfunction of cervical region: Secondary | ICD-10-CM | POA: Diagnosis not present

## 2017-07-06 DIAGNOSIS — M62830 Muscle spasm of back: Secondary | ICD-10-CM | POA: Diagnosis not present

## 2017-07-13 DIAGNOSIS — M1712 Unilateral primary osteoarthritis, left knee: Secondary | ICD-10-CM | POA: Diagnosis not present

## 2017-07-13 DIAGNOSIS — S83242A Other tear of medial meniscus, current injury, left knee, initial encounter: Secondary | ICD-10-CM | POA: Diagnosis not present

## 2017-07-26 DIAGNOSIS — X58XXXA Exposure to other specified factors, initial encounter: Secondary | ICD-10-CM | POA: Diagnosis not present

## 2017-07-26 DIAGNOSIS — M94262 Chondromalacia, left knee: Secondary | ICD-10-CM | POA: Diagnosis not present

## 2017-07-26 DIAGNOSIS — M94261 Chondromalacia, right knee: Secondary | ICD-10-CM | POA: Diagnosis not present

## 2017-07-26 DIAGNOSIS — S83272A Complex tear of lateral meniscus, current injury, left knee, initial encounter: Secondary | ICD-10-CM | POA: Diagnosis not present

## 2017-07-26 DIAGNOSIS — G8918 Other acute postprocedural pain: Secondary | ICD-10-CM | POA: Diagnosis not present

## 2017-07-26 DIAGNOSIS — S83271A Complex tear of lateral meniscus, current injury, right knee, initial encounter: Secondary | ICD-10-CM | POA: Diagnosis not present

## 2017-07-26 DIAGNOSIS — S83231A Complex tear of medial meniscus, current injury, right knee, initial encounter: Secondary | ICD-10-CM | POA: Diagnosis not present

## 2017-07-26 DIAGNOSIS — S83232A Complex tear of medial meniscus, current injury, left knee, initial encounter: Secondary | ICD-10-CM | POA: Diagnosis not present

## 2017-07-26 HISTORY — PX: TOTAL KNEE ARTHROPLASTY: SHX125

## 2017-08-03 DIAGNOSIS — M9902 Segmental and somatic dysfunction of thoracic region: Secondary | ICD-10-CM | POA: Diagnosis not present

## 2017-08-03 DIAGNOSIS — M62830 Muscle spasm of back: Secondary | ICD-10-CM | POA: Diagnosis not present

## 2017-08-03 DIAGNOSIS — M9901 Segmental and somatic dysfunction of cervical region: Secondary | ICD-10-CM | POA: Diagnosis not present

## 2017-08-03 DIAGNOSIS — M5033 Other cervical disc degeneration, cervicothoracic region: Secondary | ICD-10-CM | POA: Diagnosis not present

## 2017-08-03 DIAGNOSIS — M25562 Pain in left knee: Secondary | ICD-10-CM | POA: Diagnosis not present

## 2017-08-16 DIAGNOSIS — M25562 Pain in left knee: Secondary | ICD-10-CM | POA: Diagnosis not present

## 2017-08-31 DIAGNOSIS — Z4789 Encounter for other orthopedic aftercare: Secondary | ICD-10-CM | POA: Diagnosis not present

## 2017-08-31 DIAGNOSIS — M25561 Pain in right knee: Secondary | ICD-10-CM | POA: Diagnosis not present

## 2017-08-31 DIAGNOSIS — M9902 Segmental and somatic dysfunction of thoracic region: Secondary | ICD-10-CM | POA: Diagnosis not present

## 2017-08-31 DIAGNOSIS — M9901 Segmental and somatic dysfunction of cervical region: Secondary | ICD-10-CM | POA: Diagnosis not present

## 2017-08-31 DIAGNOSIS — M5033 Other cervical disc degeneration, cervicothoracic region: Secondary | ICD-10-CM | POA: Diagnosis not present

## 2017-08-31 DIAGNOSIS — M62830 Muscle spasm of back: Secondary | ICD-10-CM | POA: Diagnosis not present

## 2017-08-31 DIAGNOSIS — M1712 Unilateral primary osteoarthritis, left knee: Secondary | ICD-10-CM | POA: Diagnosis not present

## 2017-09-28 ENCOUNTER — Encounter: Payer: Self-pay | Admitting: Internal Medicine

## 2017-09-28 DIAGNOSIS — M5033 Other cervical disc degeneration, cervicothoracic region: Secondary | ICD-10-CM | POA: Diagnosis not present

## 2017-09-28 DIAGNOSIS — M9901 Segmental and somatic dysfunction of cervical region: Secondary | ICD-10-CM | POA: Diagnosis not present

## 2017-09-28 DIAGNOSIS — M9902 Segmental and somatic dysfunction of thoracic region: Secondary | ICD-10-CM | POA: Diagnosis not present

## 2017-09-28 DIAGNOSIS — M62830 Muscle spasm of back: Secondary | ICD-10-CM | POA: Diagnosis not present

## 2017-10-26 DIAGNOSIS — M9901 Segmental and somatic dysfunction of cervical region: Secondary | ICD-10-CM | POA: Diagnosis not present

## 2017-10-26 DIAGNOSIS — M62830 Muscle spasm of back: Secondary | ICD-10-CM | POA: Diagnosis not present

## 2017-10-26 DIAGNOSIS — M9902 Segmental and somatic dysfunction of thoracic region: Secondary | ICD-10-CM | POA: Diagnosis not present

## 2017-10-26 DIAGNOSIS — M5033 Other cervical disc degeneration, cervicothoracic region: Secondary | ICD-10-CM | POA: Diagnosis not present

## 2017-11-23 DIAGNOSIS — L821 Other seborrheic keratosis: Secondary | ICD-10-CM | POA: Diagnosis not present

## 2017-11-23 DIAGNOSIS — Z1283 Encounter for screening for malignant neoplasm of skin: Secondary | ICD-10-CM | POA: Diagnosis not present

## 2017-11-23 DIAGNOSIS — Z86018 Personal history of other benign neoplasm: Secondary | ICD-10-CM | POA: Diagnosis not present

## 2017-11-23 DIAGNOSIS — Z872 Personal history of diseases of the skin and subcutaneous tissue: Secondary | ICD-10-CM | POA: Diagnosis not present

## 2017-11-23 DIAGNOSIS — L578 Other skin changes due to chronic exposure to nonionizing radiation: Secondary | ICD-10-CM | POA: Diagnosis not present

## 2017-11-23 DIAGNOSIS — L918 Other hypertrophic disorders of the skin: Secondary | ICD-10-CM | POA: Diagnosis not present

## 2017-11-24 DIAGNOSIS — M9902 Segmental and somatic dysfunction of thoracic region: Secondary | ICD-10-CM | POA: Diagnosis not present

## 2017-11-24 DIAGNOSIS — M9901 Segmental and somatic dysfunction of cervical region: Secondary | ICD-10-CM | POA: Diagnosis not present

## 2017-11-24 DIAGNOSIS — M5033 Other cervical disc degeneration, cervicothoracic region: Secondary | ICD-10-CM | POA: Diagnosis not present

## 2017-11-24 DIAGNOSIS — M62830 Muscle spasm of back: Secondary | ICD-10-CM | POA: Diagnosis not present

## 2017-12-20 DIAGNOSIS — M9902 Segmental and somatic dysfunction of thoracic region: Secondary | ICD-10-CM | POA: Diagnosis not present

## 2017-12-20 DIAGNOSIS — M5033 Other cervical disc degeneration, cervicothoracic region: Secondary | ICD-10-CM | POA: Diagnosis not present

## 2017-12-20 DIAGNOSIS — M62830 Muscle spasm of back: Secondary | ICD-10-CM | POA: Diagnosis not present

## 2017-12-20 DIAGNOSIS — M9901 Segmental and somatic dysfunction of cervical region: Secondary | ICD-10-CM | POA: Diagnosis not present

## 2017-12-27 DIAGNOSIS — R69 Illness, unspecified: Secondary | ICD-10-CM | POA: Diagnosis not present

## 2018-01-05 ENCOUNTER — Ambulatory Visit: Payer: Self-pay

## 2018-01-11 ENCOUNTER — Ambulatory Visit (INDEPENDENT_AMBULATORY_CARE_PROVIDER_SITE_OTHER): Payer: Medicare HMO | Admitting: Internal Medicine

## 2018-01-11 ENCOUNTER — Encounter: Payer: Self-pay | Admitting: Internal Medicine

## 2018-01-11 ENCOUNTER — Ambulatory Visit (INDEPENDENT_AMBULATORY_CARE_PROVIDER_SITE_OTHER): Payer: Medicare HMO

## 2018-01-11 VITALS — BP 142/92 | HR 72 | Temp 98.3°F | Resp 16 | Ht 67.0 in | Wt 173.8 lb

## 2018-01-11 VITALS — BP 118/78 | HR 78 | Temp 98.3°F | Ht 67.0 in | Wt 173.0 lb

## 2018-01-11 DIAGNOSIS — Z1239 Encounter for other screening for malignant neoplasm of breast: Secondary | ICD-10-CM

## 2018-01-11 DIAGNOSIS — H6690 Otitis media, unspecified, unspecified ear: Secondary | ICD-10-CM

## 2018-01-11 DIAGNOSIS — R059 Cough, unspecified: Secondary | ICD-10-CM

## 2018-01-11 DIAGNOSIS — Z0000 Encounter for general adult medical examination without abnormal findings: Secondary | ICD-10-CM

## 2018-01-11 DIAGNOSIS — R05 Cough: Secondary | ICD-10-CM | POA: Diagnosis not present

## 2018-01-11 MED ORDER — AZITHROMYCIN 250 MG PO TABS: ORAL_TABLET | ORAL | 0 refills | Status: AC

## 2018-01-11 MED ORDER — GUAIFENESIN-CODEINE 100-10 MG/5ML PO SYRP: 5.0000 mL | ORAL_SOLUTION | Freq: Three times a day (TID) | ORAL | 0 refills | Status: DC | PRN

## 2018-01-11 NOTE — Progress Notes (Signed)
Date:  01/11/2018   Name:  Dana Cameron   DOB:  1945/05/06   MRN:  [id number]   Chief Complaint: Cough (Started this past weekend. Cough-no production. No fever. Headache around temples. Started with sore throat and thats now gone away. Ears are stopped up. )  Cough  This is a chronic problem. The current episode started in the past 7 days. The problem has been gradually worsening. The problem occurs every few minutes. The cough is non-productive. Associated symptoms include ear pain, nasal congestion and postnasal drip. Pertinent negatives include no chest pain, chills, fever, sore throat, shortness of breath or wheezing. Treatments tried: claritin and cough lozenges.    Review of Systems  Constitutional: Positive for fatigue. Negative for chills and fever.  HENT: Positive for ear pain and postnasal drip. Negative for sore throat and trouble swallowing.   Respiratory: Positive for cough. Negative for chest tightness, shortness of breath and wheezing.   Cardiovascular: Negative for chest pain and palpitations.    Patient Active Problem List   Diagnosis Date Noted  . Colon polyp, hyperplastic 01/28/2017  . Arthritis of right hip 01/26/2016  . Hyperlipidemia, mild 03/28/2015  . Constipation 03/28/2015  . Fibrocystic breast 09/17/2014  . Environmental and seasonal allergies 09/17/2014  . 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 Gonalgia 09/17/2014  . Hot flash, menopausal 09/17/2014    No Known Allergies  Past Surgical History:  Procedure Laterality Date  . BREAST BIOPSY Left 1993   neg  . COLONOSCOPY  12/31/2003   one hyperplastic polyp  . EXCISION VAGINAL CYST    . TOTAL KNEE ARTHROPLASTY Right 07/26/2017    Social History   Tobacco Use  . Smoking status: Never Smoker  . Smokeless tobacco: Never Used  Substance Use Topics  . Alcohol use: No    Alcohol/week: 0.0 standard drinks  . Drug use: No     Medication list has been reviewed and updated.  Current Meds    Medication Sig  . cetirizine (ZYRTEC) 10 MG tablet Take 10 mg by mouth daily.  . Cholecalciferol (VITAMIN D3) 50 MCG (2000 UT) TABS Take by mouth.  . Cholecalciferol (VITAMIN D3) 5000 UNITS CAPS Take 1 capsule by mouth daily.  . Estradiol-Estriol-Progesterone (BIEST/PROGESTERONE) CREA Place 1 mL onto the skin daily. 80/20 strength  . fluticasone (FLONASE) 50 MCG/ACT nasal spray USE TWO SPRAY(S) IN EACH NOSTRIL ONCE DAILY  . ibuprofen (ADVIL,MOTRIN) 800 MG tablet   . loratadine (CLARITIN) 10 MG tablet Take 10 mg by mouth daily.  . Na Sulfate-K Sulfate-Mg Sulf (SUPREP BOWEL PREP KIT) 17.5-3.13-1.6 GM/177ML SOLN Take 1 kit by mouth as directed.  . NONFORMULARY OR COMPOUNDED ITEM Progesterone 3.5% HRT - apply 1-2 ml topically daily  . Nutritional Supplements (JUICE PLUS FIBRE PO) Take by mouth.  . Omega 3 1000 MG CAPS Take 1 capsule by mouth daily.    PHQ 2/9 Scores 01/11/2018 12/30/2016 01/26/2016 01/21/2015  PHQ - 2 Score 0 0 0 0    Physical Exam  Constitutional: She is oriented to person, place, and time. She appears well-developed and well-nourished.  HENT:  Right Ear: External ear and ear canal normal. Tympanic membrane is retracted. Tympanic membrane is not erythematous.  Left Ear: External ear and ear canal normal. Tympanic membrane is erythematous and retracted.  Nose: Right sinus exhibits maxillary sinus tenderness. Right sinus exhibits no frontal sinus tenderness. Left sinus exhibits maxillary sinus tenderness. Left sinus exhibits no frontal sinus tenderness.  Mouth/Throat: Uvula is midline and mucous  membranes are normal. No oral lesions. Posterior oropharyngeal erythema present. No oropharyngeal exudate.  Cardiovascular: Normal rate, regular rhythm and normal heart sounds.  Pulmonary/Chest: Breath sounds normal. She has no wheezes. She has no rales.  Lymphadenopathy:    She has no cervical adenopathy.  Neurological: She is alert and oriented to person, place, and time.    BP  118/78 (BP Location: Right Arm, Patient Position: Sitting, Cuff Size: Normal)   Pulse 78   Temp 98.3 F (36.8 C) (Oral)   Ht '5\' 7"'  (1.702 m)   Wt 173 lb (78.5 kg)   SpO2 99%   BMI 27.10 kg/m   Assessment and Plan: 1. Acute otitis media, unspecified otitis media type - azithromycin (ZITHROMAX Z-PAK) 250 MG tablet; UAD  Dispense: 6 each; Refill: 0  2. Cough Continue claritin Resume Flonase - guaiFENesin-codeine (ROBITUSSIN AC) 100-10 MG/5ML syrup; Take 5 mLs by mouth 3 (three) times daily as needed for cough.  Dispense: 150 mL; Refill: 0   Partially dictated using Editor, commissioning. Any errors are unintentional.  Halina Maidens, MD [facility]  01/11/2018

## 2018-01-11 NOTE — Patient Instructions (Signed)
Dana Cameron , Thank you for taking time to come for your Medicare Wellness Visit. I appreciate your ongoing commitment to your health goals. Please review the following plan we discussed and let me know if I can assist you in the future.   Screening recommendations/referrals: Colonoscopy: due please reschedule or discuss cologuard Mammogram: due after 12/13 ordered today Please call [phone] to schedule your mammogram.  Bone Density: done 11/29/13 Recommended yearly ophthalmology/optometry visit for glaucoma screening and checkup Recommended yearly dental visit for hygiene and checkup  Vaccinations: Influenza vaccine: due Pneumococcal vaccine: done 01/21/15 Tdap vaccine: done 08/25/09 Shingles vaccine: Shingrix discussed. Please contact your pharmacy for insurance coverage.    Advanced directives: Please bring a copy of your health care power of attorney and living will to the office at your convenience.  Conditions/risks identified: Recommend increasing physical activity to 150 minutes per week.  Next appointment: 01/30/18 8:20 Dr. Army Melia   Preventive Care 62 Years and Older, Female Preventive care refers to lifestyle choices and visits with your health care provider that can promote health and wellness. What does preventive care include?  A yearly physical exam. This is also called an annual well check.  Dental exams once or twice a year.  Routine eye exams. Ask your health care provider how often you should have your eyes checked.  Personal lifestyle choices, including:  Daily care of your teeth and gums.  Regular physical activity.  Eating a healthy diet.  Avoiding tobacco and drug use.  Limiting alcohol use.  Practicing safe sex.  Taking low-dose aspirin every day.  Taking vitamin and mineral supplements as recommended by your health care provider. What happens during an annual well check? The services and screenings done by your health care provider during  your annual well check will depend on your age, overall health, lifestyle risk factors, and family history of disease. Counseling  Your health care provider may ask you questions about your:  Alcohol use.  Tobacco use.  Drug use.  Emotional well-being.  Home and relationship well-being.  Sexual activity.  Eating habits.  History of falls.  Memory and ability to understand (cognition).  Work and work Statistician.  Reproductive health. Screening  You may have the following tests or measurements:  Height, weight, and BMI.  Blood pressure.  Lipid and cholesterol levels. These may be checked every 5 years, or more frequently if you are over 39 years old.  Skin check.  Lung cancer screening. You may have this screening every year starting at age 82 if you have a 30-pack-year history of smoking and currently smoke or have quit within the past 15 years.  Fecal occult blood test (FOBT) of the stool. You may have this test every year starting at age 33.  Flexible sigmoidoscopy or colonoscopy. You may have a sigmoidoscopy every 5 years or a colonoscopy every 10 years starting at age 40.  Hepatitis C blood test.  Hepatitis B blood test.  Sexually transmitted disease (STD) testing.  Diabetes screening. This is done by checking your blood sugar (glucose) after you have not eaten for a while (fasting). You may have this done every 1-3 years.  Bone density scan. This is done to screen for osteoporosis. You may have this done starting at age 78.  Mammogram. This may be done every 1-2 years. Talk to your health care provider about how often you should have regular mammograms. Talk with your health care provider about your test results, treatment options, and if necessary, the need  for more tests. Vaccines  Your health care provider may recommend certain vaccines, such as:  Influenza vaccine. This is recommended every year.  Tetanus, diphtheria, and acellular pertussis (Tdap,  Td) vaccine. You may need a Td booster every 10 years.  Zoster vaccine. You may need this after age 38.  Pneumococcal 13-valent conjugate (PCV13) vaccine. One dose is recommended after age 17.  Pneumococcal polysaccharide (PPSV23) vaccine. One dose is recommended after age 71. Talk to your health care provider about which screenings and vaccines you need and how often you need them. This information is not intended to replace advice given to you by your health care provider. Make sure you discuss any questions you have with your health care provider. Document Released: 03/14/2015 Document Revised: 11/05/2015 Document Reviewed: 12/17/2014 Elsevier Interactive Patient Education  2017 [employer] Prevention in the Home Falls can cause injuries. They can happen to people of all ages. There are many things you can do to make your home safe and to help prevent falls. What can I do on the outside of my home?  Regularly fix the edges of walkways and driveways and fix any cracks.  Remove anything that might make you trip as you walk through a door, such as a raised step or threshold.  Trim any bushes or trees on the path to your home.  Use bright outdoor lighting.  Clear any walking paths of anything that might make someone trip, such as rocks or tools.  Regularly check to see if handrails are loose or broken. Make sure that both sides of any steps have handrails.  Any raised decks and porches should have guardrails on the edges.  Have any leaves, snow, or ice cleared regularly.  Use sand or salt on walking paths during winter.  Clean up any spills in your garage right away. This includes oil or grease spills. What can I do in the bathroom?  Use night lights.  Install grab bars by the toilet and in the tub and shower. Do not use towel bars as grab bars.  Use non-skid mats or decals in the tub or shower.  If you need to sit down in the shower, use a plastic, non-slip  stool.  Keep the floor dry. Clean up any water that spills on the floor as soon as it happens.  Remove soap buildup in the tub or shower regularly.  Attach bath mats securely with double-sided non-slip rug tape.  Do not have throw rugs and other things on the floor that can make you trip. What can I do in the bedroom?  Use night lights.  Make sure that you have a light by your bed that is easy to reach.  Do not use any sheets or blankets that are too big for your bed. They should not hang down onto the floor.  Have a firm chair that has side arms. You can use this for support while you get dressed.  Do not have throw rugs and other things on the floor that can make you trip. What can I do in the kitchen?  Clean up any spills right away.  Avoid walking on wet floors.  Keep items that you use a lot in easy-to-reach places.  If you need to reach something above you, use a strong step stool that has a grab bar.  Keep electrical cords out of the way.  Do not use floor polish or wax that makes floors slippery. If you must use wax, use  non-skid floor wax.  Do not have throw rugs and other things on the floor that can make you trip. What can I do with my stairs?  Do not leave any items on the stairs.  Make sure that there are handrails on both sides of the stairs and use them. Fix handrails that are broken or loose. Make sure that handrails are as long as the stairways.  Check any carpeting to make sure that it is firmly attached to the stairs. Fix any carpet that is loose or worn.  Avoid having throw rugs at the top or bottom of the stairs. If you do have throw rugs, attach them to the floor with carpet tape.  Make sure that you have a light switch at the top of the stairs and the bottom of the stairs. If you do not have them, ask someone to add them for you. What else can I do to help prevent falls?  Wear shoes that:  Do not have high heels.  Have rubber bottoms.  Are  comfortable and fit you well.  Are closed at the toe. Do not wear sandals.  If you use a stepladder:  Make sure that it is fully opened. Do not climb a closed stepladder.  Make sure that both sides of the stepladder are locked into place.  Ask someone to hold it for you, if possible.  Clearly mark and make sure that you can see:  Any grab bars or handrails.  First and last steps.  Where the edge of each step is.  Use tools that help you move around (mobility aids) if they are needed. These include:  Canes.  Walkers.  Scooters.  Crutches.  Turn on the lights when you go into a dark area. Replace any light bulbs as soon as they burn out.  Set up your furniture so you have a clear path. Avoid moving your furniture around.  If any of your floors are uneven, fix them.  If there are any pets around you, be aware of where they are.  Review your medicines with your doctor. Some medicines can make you feel dizzy. This can increase your chance of falling. Ask your doctor what other things that you can do to help prevent falls. This information is not intended to replace advice given to you by your health care provider. Make sure you discuss any questions you have with your health care provider. Document Released: 12/12/2008 Document Revised: 07/24/2015 Document Reviewed: 03/22/2014 Elsevier Interactive Patient Education  2017 [employer].

## 2018-01-11 NOTE — Progress Notes (Signed)
Subjective:   Dana Cameron is a 72 y.o. female who presents for Medicare Annual (Subsequent) preventive examination.  Review of Systems:   Cardiac Risk Factors include: advanced age (>47mn, >>52women);dyslipidemia     Objective:     Vitals: BP (!) 142/92 (BP Location: Left Arm, Patient Position: Sitting, Cuff Size: Normal)   Pulse 72   Temp 98.3 F (36.8 C) (Oral)   Resp 16   Ht '5\' 7"'  (1.702 m)   Wt 173 lb 12.8 oz (78.8 kg)   BMI 27.22 kg/m   Body mass index is 27.22 kg/m.  Advanced Directives 12/30/2016 01/26/2016  Does Patient Have a Medical Advance Directive? Yes Yes  Type of [place] will Living will  Copy of HT[place]in Chart? No - copy requested -    Tobacco Social History   Tobacco Use  Smoking Status Never Smoker  Smokeless Tobacco Never Used     Counseling given: Not Answered   Clinical Intake:  Pre-visit preparation completed: Yes  Pain : 0-10 Pain Score: 7  Pain Type: Acute pain Pain Location: (sinuses, headache) Pain Radiating Towards: face Pain Descriptors / Indicators: Pressure Pain Onset: In the past 7 days Pain Frequency: Constant     Nutritional Status: BMI 25 -29 Overweight Nutritional Risks: None Diabetes: No  How often do you need to have someone help you when you read instructions, pamphlets, or other written materials from your doctor or pharmacy?: 1 - Never What is the last grade level you completed in school?: 12th grade  Interpreter Needed?: No  Information entered by :: KClemetine MarkerLPN  Past Medical History:  Diagnosis Date  . Allergy    Fall Seasonal Allergies  . Arthritis of right hip   . Cataract   . Hyperlipidemia   . Vaginal prolapse    Past Surgical History:  Procedure Laterality Date  . BREAST BIOPSY Left 1993   neg  . COLONOSCOPY  12/31/2003   one hyperplastic polyp  . EXCISION VAGINAL CYST    . TOTAL KNEE ARTHROPLASTY Right  07/26/2017   Family History  Problem Relation Age of Onset  . Transient ischemic attack Mother   . Hypertension Father   . Stroke Father   . Asthma Sister   . Lung cancer Brother   . Cancer Brother   . Varicose Veins Daughter   . Breast cancer Neg Hx    Social History   Socioeconomic History  . Marital status: Married    Spouse name: Not on file  . Number of children: 2  . Years of education: 151 . Highest education level: High school graduate  Occupational History  . Occupation: retired  SScientific laboratory technician . Financial resource strain: Not hard at all  . Food insecurity:    Worry: Never true    Inability: Never true  . Transportation needs:    Medical: No    Non-medical: No  Tobacco Use  . Smoking status: Never Smoker  . Smokeless tobacco: Never Used  Substance and Sexual Activity  . Alcohol use: No    Alcohol/week: 0.0 standard drinks  . Drug use: No  . Sexual activity: Not Currently    Birth control/protection: None  Lifestyle  . Physical activity:    Days per week: 0 days    Minutes per session: 0 min  . Stress: Not at all  Relationships  . Social connections:    Talks on phone: More than three  times a week    Gets together: Three times a week    Attends religious service: 1 to 4 times per year    Active member of club or organization: No    Attends meetings of clubs or organizations: Never    Relationship status: Married  Other Topics Concern  . Not on file  Social History Narrative   Pt lives in [place] January, February and March each year, plays golf there    Outpatient Encounter Medications as of 01/11/2018  Medication Sig  . Cholecalciferol (VITAMIN D3) 50 MCG (2000 UT) TABS Take by mouth.  . Estradiol-Estriol-Progesterone (BIEST/PROGESTERONE) CREA Place 1 mL onto the skin daily. 80/20 strength  . fluticasone (FLONASE) 50 MCG/ACT nasal spray USE TWO SPRAY(S) IN EACH NOSTRIL ONCE DAILY  . ibuprofen (ADVIL,MOTRIN) 800 MG tablet   . loratadine  (CLARITIN) 10 MG tablet Take 10 mg by mouth daily.  . Na Sulfate-K Sulfate-Mg Sulf (SUPREP BOWEL PREP KIT) 17.5-3.13-1.6 GM/177ML SOLN Take 1 kit by mouth as directed.  . NONFORMULARY OR COMPOUNDED ITEM Progesterone 3.5% HRT - apply 1-2 ml topically daily  . Nutritional Supplements (JUICE PLUS FIBRE PO) Take by mouth.  . Omega 3 1000 MG CAPS Take 1 capsule by mouth daily.  . cetirizine (ZYRTEC) 10 MG tablet Take 10 mg by mouth daily.  . Cholecalciferol (VITAMIN D3) 5000 UNITS CAPS Take 1 capsule by mouth daily.  . predniSONE (DELTASONE) 10 MG tablet Take 6 on day 1, 5 on day 2, 4 on day 3, 3 on day 4, 2 on day 5 and 1 on day 1 then stop.   No facility-administered encounter medications on file as of 01/11/2018.     Activities of Daily Living In your present state of health, do you have any difficulty performing the following activities: 01/11/2018  Hearing? N  Comment declines hearing aids  Vision? N  Comment wears reading glasses  Difficulty concentrating or making decisions? N  Walking or climbing stairs? N  Dressing or bathing? N  Doing errands, shopping? N  Preparing Food and eating ? N  Using the Toilet? N  In the past six months, have you accidently leaked urine? N  Do you have problems with loss of bowel control? N  Managing your Medications? N  Managing your Finances? N  Housekeeping or managing your Housekeeping? N  Some recent data might be hidden    Patient Care Team: Glean Hess, MD as PCP - General (Family Medicine)    Assessment:   This is a routine wellness examination for Dana Cameron.  Exercise Activities and Dietary recommendations Current Exercise Habits: The patient does not participate in regular exercise at present  Goals    . Exercise 150 minutes per week (moderate activity) (pt-stated)     Pt states she would like to walk or do some strengthening exercise, 150 minutes per week.    . Increase physical activity       Fall Risk Fall Risk   01/11/2018 12/30/2016 01/26/2016 01/21/2015  Falls in the past year? 0 No No No  Number falls in past yr: 0 - - -   FALL RISK PREVENTION PERTAINING TO THE HOME:  Any stairs in or around the home WITH handrails? Yes  will be adding handrails  Home free of loose throw rugs in walkways, pet beds, electrical cords, etc? Yes  Adequate lighting in your home to reduce risk of falls? Yes   ASSISTIVE DEVICES UTILIZED TO PREVENT FALLS:  Life alert? No  Use of a cane, walker or w/c? No  Grab bars in the bathroom? No  Shower chair or bench in shower? No  Elevated toilet seat or a handicapped toilet? No   DME ORDERS:  DME order needed?  No   TIMED UP AND GO:  Was the test performed? Yes .  Length of time to ambulate 10 feet: 6 sec.   GAIT:  Appearance of gait: Gait stead-fast and without the use of an assistive device.  Education: Fall risk prevention has been discussed.  Intervention(s) required? No   Depression Screen PHQ 2/9 Scores 01/11/2018 12/30/2016 01/26/2016 01/21/2015  PHQ - 2 Score 0 0 0 0     Cognitive Function     6CIT Screen 01/11/2018 12/30/2016 01/26/2016  What Year? 0 points 0 points 0 points  What month? 0 points 0 points 0 points  What time? 0 points 0 points 0 points  Count back from 20 0 points 0 points 0 points  Months in reverse 0 points 0 points 0 points  Repeat phrase 0 points 0 points 0 points  Total Score 0 0 0    Immunization History  Administered Date(s) Administered  . Influenza, High Dose Seasonal PF 12/30/2016  . Influenza, Seasonal, Injecte, Preservative Fre 12/15/2011  . Influenza,inj,Quad PF,6+ Mos 12/15/2012, 12/18/2013, 11/21/2014, 01/26/2016  . Pneumococcal Conjugate-13 01/21/2015  . Pneumococcal Polysaccharide-23 08/24/2012  . Tdap 08/25/2009  . Zoster 06/22/2010    Qualifies for Shingles Vaccine? Yes  Zostavax completed 06/22/10. Due for Shingrix. Education has been provided regarding the importance of this vaccine. Pt has been  advised to call insurance company to determine out of pocket expense. Advised may also receive vaccine at local pharmacy or Health Dept. Verbalized acceptance and understanding.  Tdap: Up to date  Flu Vaccine: Due for Flu vaccine. Does the patient want to receive this vaccine today?  Yes . Pt would like to received vaccine but currently has cold symptoms and will be seeing Dr. Army Melia today for acute visit. Advised receive flu vaccine once sxs have resolved.  Pneumococcal Vaccine:Up to date   Screening Tests Health Maintenance  Topic Date Due  . INFLUENZA VACCINE  09/29/2017  . COLONOSCOPY  03/01/2018 (Originally 12/30/2013)  . MAMMOGRAM  02/10/2018  . TETANUS/TDAP  08/26/2019  . PNA vac Low Risk Adult  Completed  . DEXA SCAN  Addressed  . Hepatitis C Screening  Addressed    Cancer Screenings:  Colorectal Screening: Completed 12/31/2003. Repeat every 10 years; Pt had to cancel colonoscopy earlier this year due to knee surgery. Interested in cologuard as an alternative if qualifies.   Mammogram: Completed 02/10/17. Repeat every year; ordered today due to patient scheduled for CPE with Dr. Army Melia on 01/30/18.  Bone Density: Completed 12/25/13. Results reflect OSTEOPENIA, pt currently taking vitamin D 2000 IU daily and juice plus supplement for bone health.  Lung Cancer Screening: (Low Dose CT Chest recommended if Age 56-80 years, 30 pack-year currently smoking OR have quit w/in 15years.) does not qualify.     Additional Screening:  Hepatitis C Screening: does qualify; Completed 11/29/14  Vision Screening: Recommended annual ophthalmology exams for early detection of glaucoma and other disorders of the eye. Is the patient up to date with their annual eye exam?  Yes  Who is the provider or what is the name of the office in which the pt attends annual eye exams? [facility] Dr. Michelene Heady  Dental Screening: Recommended annual dental exams for proper oral hygiene  Community  Resource Referral:  CRR required this visit?  No      Plan:    I have personally reviewed and addressed the Medicare Annual Wellness questionnaire and have noted the following in the patient's chart:  A. Medical and social history B. Use of alcohol, tobacco or illicit drugs  C. Current medications and supplements D. Functional ability and status E.  Nutritional status F.  Physical activity G. Advance directives H. List of other physicians I.  Hospitalizations, surgeries, and ER visits in previous 12 months J.  [place] such as hearing and vision if needed, cognitive and depression L. Referrals and appointments   In addition, I have reviewed and discussed with patient certain preventive protocols, quality metrics, and best practice recommendations. A written personalized care plan for preventive services as well as general preventive health recommendations were provided to patient.   Signed,  Clemetine Marker, LPN Nurse Health Advisor   Nurse Notes: pt had to cancel colonoscopy earlier this year due to knee surgery, she is very reluctant to complete an additional colonoscopy and interested in cologuard testing. Advised I would need to consult with Dr. Army Melia regarding ordering of cologuard to ensure she is an appropriate candidate due to history of one 10 mm hyperplastic polyp on last colonoscopy. Please discuss with patient at next appt on 01/30/18. Thank you!

## 2018-01-18 DIAGNOSIS — M5033 Other cervical disc degeneration, cervicothoracic region: Secondary | ICD-10-CM | POA: Diagnosis not present

## 2018-01-18 DIAGNOSIS — M9902 Segmental and somatic dysfunction of thoracic region: Secondary | ICD-10-CM | POA: Diagnosis not present

## 2018-01-18 DIAGNOSIS — M9901 Segmental and somatic dysfunction of cervical region: Secondary | ICD-10-CM | POA: Diagnosis not present

## 2018-01-18 DIAGNOSIS — M62830 Muscle spasm of back: Secondary | ICD-10-CM | POA: Diagnosis not present

## 2018-01-29 ENCOUNTER — Other Ambulatory Visit: Payer: Self-pay | Admitting: Internal Medicine

## 2018-01-30 ENCOUNTER — Ambulatory Visit (INDEPENDENT_AMBULATORY_CARE_PROVIDER_SITE_OTHER): Payer: Medicare HMO | Admitting: Internal Medicine

## 2018-01-30 ENCOUNTER — Encounter: Payer: Self-pay | Admitting: Internal Medicine

## 2018-01-30 VITALS — BP 116/74 | HR 65 | Ht 67.0 in | Wt 178.0 lb

## 2018-01-30 DIAGNOSIS — E785 Hyperlipidemia, unspecified: Secondary | ICD-10-CM

## 2018-01-30 DIAGNOSIS — K635 Polyp of colon: Secondary | ICD-10-CM | POA: Diagnosis not present

## 2018-01-30 DIAGNOSIS — Z0000 Encounter for general adult medical examination without abnormal findings: Secondary | ICD-10-CM | POA: Diagnosis not present

## 2018-01-30 DIAGNOSIS — Z1231 Encounter for screening mammogram for malignant neoplasm of breast: Secondary | ICD-10-CM

## 2018-01-30 DIAGNOSIS — N819 Female genital prolapse, unspecified: Secondary | ICD-10-CM | POA: Diagnosis not present

## 2018-01-30 LAB — POCT URINALYSIS DIPSTICK
Bilirubin, UA: NEGATIVE
Blood, UA: NEGATIVE
Glucose, UA: NEGATIVE
Ketones, UA: NEGATIVE
NITRITE UA: NEGATIVE
PH UA: 6 (ref 5.0–8.0)
PROTEIN UA: NEGATIVE
Spec Grav, UA: 1.01 (ref 1.010–1.025)
Urobilinogen, UA: 0.2 E.U./dL

## 2018-01-30 NOTE — Progress Notes (Signed)
Date:  01/30/2018   Name:  Dana Cameron   DOB:  26-Mar-1945   MRN:  [id number]   Chief Complaint: Annual Exam (Breast Exam. Patient was seen by Dana Cameron last month for MAW. High dose flu shot. ) Dana Cameron is a 72 y.o. female who presents today for her Complete Annual Exam. She feels fairly well. She reports exercising walking. She reports she is sleeping fairly well. She denies breast issues.  Mammogram is scheduled. Flu vaccine today. She had colonoscopy scheduled but cancelled when she needed knee surgery.  She has not yet rescheduled. She has meniscus repair on the left this summer and is doing great.  No need for pain medications now.  Menopausal sx - she is doing well on topical estrogen and progesterone.   No hot flashes or sweats, sleep is good, mood is normal.  When she tried to stop in the past, all of these issues occurred.  Pelvic prolapse - this seems to be getting slightly worse.  Was seen 5 years ago by [facility] in [place].  She has been using cream and sx were stable until recently.  She wants to wait until her return from [facility] to address the issue.  Currently no bleeding or pain, just discomfort.  Review of Systems  Constitutional: Negative for chills, fatigue and fever.  HENT: Negative for congestion, hearing loss, tinnitus, trouble swallowing and voice change.   Eyes: Negative for visual disturbance.  Respiratory: Negative for cough, chest tightness, shortness of breath and wheezing.   Cardiovascular: Negative for chest pain, palpitations and leg swelling.  Gastrointestinal: Negative for abdominal pain, constipation, diarrhea and vomiting.  Endocrine: Negative for polydipsia and polyuria.  Genitourinary: Positive for vaginal pain. Negative for dysuria, frequency, genital sores, hematuria, urgency and vaginal discharge.  Musculoskeletal: Negative for arthralgias (mild knee OA), gait problem and joint swelling.  Skin: Negative for color change and rash.    Neurological: Negative for dizziness, tremors, light-headedness and headaches.  Hematological: Negative for adenopathy. Does not bruise/bleed easily.  Psychiatric/Behavioral: Negative for dysphoric mood and sleep disturbance. The patient is not nervous/anxious.     Patient Active Problem List   Diagnosis Date Noted  . Colon polyp, hyperplastic 01/28/2017  . Arthritis of right hip 01/26/2016  . Hyperlipidemia, mild 03/28/2015  . Fibrocystic breast 09/17/2014  . Environmental and seasonal allergies 09/17/2014  . 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 Hot flash, menopausal 09/17/2014    No Known Allergies  Past Surgical History:  Procedure Laterality Date  . BREAST BIOPSY Left 1993   neg  . COLONOSCOPY  12/31/2003   one hyperplastic polyp  . EXCISION VAGINAL CYST    . TOTAL KNEE ARTHROPLASTY Right 07/26/2017    Social History   Tobacco Use  . Smoking status: Never Smoker  . Smokeless tobacco: Never Used  Substance Use Topics  . Alcohol use: No    Alcohol/week: 0.0 standard drinks  . Drug use: No     Medication list has been reviewed and updated.  Current Meds  Medication Sig  . cetirizine (ZYRTEC) 10 MG tablet Take 10 mg by mouth daily.  . Cholecalciferol (VITAMIN D3) 50 MCG (2000 UT) TABS Take by mouth.  . Estradiol-Estriol-Progesterone (BIEST/PROGESTERONE) CREA Place 1 mL onto the skin daily. 80/20 strength  . fluticasone (FLONASE) 50 MCG/ACT nasal spray USE TWO SPRAY(S) IN EACH NOSTRIL ONCE DAILY  . ibuprofen (ADVIL,MOTRIN) 800 MG tablet   . NONFORMULARY OR COMPOUNDED ITEM Progesterone 3.5% HRT - apply 1-2 ml topically  daily  . Nutritional Supplements (JUICE PLUS FIBRE PO) Take by mouth.  . Omega 3 1000 MG CAPS Take 1 capsule by mouth daily.    PHQ 2/9 Scores 01/11/2018 12/30/2016 01/26/2016 01/21/2015  PHQ - 2 Score 0 0 0 0    Physical Exam  Constitutional: She is oriented to person, place, and time. She appears well-developed and well-nourished. No distress.   HENT:  Head: Normocephalic and atraumatic.  Right Ear: Tympanic membrane and ear canal normal.  Left Ear: Tympanic membrane and ear canal normal.  Nose: Right sinus exhibits no maxillary sinus tenderness. Left sinus exhibits no maxillary sinus tenderness.  Mouth/Throat: Uvula is midline and oropharynx is clear and moist.  Eyes: Conjunctivae and EOM are normal. Right eye exhibits no discharge. Left eye exhibits no discharge. No scleral icterus.  Neck: Normal range of motion. Carotid bruit is not present. No erythema present. No thyromegaly present.  Cardiovascular: Normal rate, regular rhythm, normal heart sounds and normal pulses.  Pulmonary/Chest: Effort normal. No respiratory distress. She has no wheezes. Right breast exhibits no mass, no nipple discharge, no skin change and no tenderness. Left breast exhibits no mass, no nipple discharge, no skin change and no tenderness.  Abdominal: Soft. Bowel sounds are normal. There is no hepatosplenomegaly. There is no tenderness. There is no CVA tenderness.  Musculoskeletal: Normal range of motion. She exhibits no edema or tenderness.  Lymphadenopathy:    She has no cervical adenopathy.    She has no axillary adenopathy.  Neurological: She is alert and oriented to person, place, and time. She has normal reflexes. No cranial nerve deficit or sensory deficit.  Skin: Skin is warm, dry and intact. No rash noted.  Psychiatric: She has a normal mood and affect. Her speech is normal and behavior is normal. Thought content normal.  Nursing note and vitals reviewed.   BP 116/74 (BP Location: Right Arm, Patient Position: Sitting, Cuff Size: Normal)   Pulse 65   Ht 5\' 7"  (1.702 m)   Wt 178 lb (80.7 kg)   SpO2 98%   BMI 27.88 kg/m   Assessment and Plan: 1. Annual physical exam Normal exam, doing well - POCT urinalysis dipstick  2. Encounter for screening mammogram for breast cancer Already scheduled  3. Hyperlipidemia, mild Check labs and  advise - Comprehensive metabolic panel - Lipid panel - TSH  4. Hyperplastic colonic polyp, unspecified part of colon Recommend repeat colonoscopy - pt can schedule when she returns from winter in [facility] - CBC with Differential/Platelet   Partially dictated using Editor, commissioning. Any errors are unintentional.  Halina Maidens, MD [facility]  01/30/2018

## 2018-01-31 LAB — LIPID PANEL
CHOL/HDL RATIO: 2.9 ratio (ref 0.0–4.4)
Cholesterol, Total: 170 mg/dL (ref 100–199)
HDL: 58 mg/dL (ref >39)
LDL Calculated: 84 mg/dL (ref 0–99)
TRIGLYCERIDES: 140 mg/dL (ref 0–149)
VLDL Cholesterol Cal: 28 mg/dL (ref 5–40)

## 2018-01-31 LAB — CBC WITH DIFFERENTIAL/PLATELET
BASOS: 1 %
Basophils Absolute: 0 10*3/uL (ref 0.0–0.2)
EOS (ABSOLUTE): 0 10*3/uL (ref 0.0–0.4)
Eos: 1 %
HEMATOCRIT: 44.2 % (ref 34.0–46.6)
Hemoglobin: 14.6 g/dL (ref 11.1–15.9)
IMMATURE GRANULOCYTES: 0 %
Immature Grans (Abs): 0 10*3/uL (ref 0.0–0.1)
LYMPHS ABS: 1.6 10*3/uL (ref 0.7–3.1)
Lymphs: 33 %
MCH: 30.5 pg (ref 26.6–33.0)
MCHC: 33 g/dL (ref 31.5–35.7)
MCV: 93 fL (ref 79–97)
MONOS ABS: 0.5 10*3/uL (ref 0.1–0.9)
Monocytes: 9 %
NEUTROS ABS: 2.8 10*3/uL (ref 1.4–7.0)
Neutrophils: 56 %
PLATELETS: 293 10*3/uL (ref 150–450)
RBC: 4.78 x10E6/uL (ref 3.77–5.28)
RDW: 12.7 % (ref 12.3–15.4)
WBC: 5 10*3/uL (ref 3.4–10.8)

## 2018-01-31 LAB — COMPREHENSIVE METABOLIC PANEL
A/G RATIO: 2 (ref 1.2–2.2)
ALK PHOS: 85 IU/L (ref 39–117)
ALT: 12 IU/L (ref 0–32)
AST: 15 IU/L (ref 0–40)
Albumin: 4.4 g/dL (ref 3.5–4.8)
BILIRUBIN TOTAL: 0.5 mg/dL (ref 0.0–1.2)
BUN/Creatinine Ratio: 15 (ref 12–28)
BUN: 14 mg/dL (ref 8–27)
CALCIUM: 9.3 mg/dL (ref 8.7–10.3)
CHLORIDE: 104 mmol/L (ref 96–106)
CO2: 24 mmol/L (ref 20–29)
Creatinine, Ser: 0.96 mg/dL (ref 0.57–1.00)
GFR calc Af Amer: 69 mL/min/{1.73_m2} (ref >59)
GFR, EST NON AFRICAN AMERICAN: 60 mL/min/{1.73_m2} (ref >59)
GLOBULIN, TOTAL: 2.2 g/dL (ref 1.5–4.5)
Glucose: 85 mg/dL (ref 65–99)
POTASSIUM: 4.3 mmol/L (ref 3.5–5.2)
Sodium: 142 mmol/L (ref 134–144)
Total Protein: 6.6 g/dL (ref 6.0–8.5)

## 2018-01-31 LAB — TSH: TSH: 2.85 u[IU]/mL (ref 0.450–4.500)

## 2018-02-07 DIAGNOSIS — R69 Illness, unspecified: Secondary | ICD-10-CM | POA: Diagnosis not present

## 2018-02-13 ENCOUNTER — Ambulatory Visit
Admission: RE | Admit: 2018-02-13 | Discharge: 2018-02-13 | Disposition: A | Discharge status: Home / Self Care | Payer: Medicare HMO | Source: Ambulatory Visit | Attending: Internal Medicine | Admitting: Internal Medicine

## 2018-02-13 DIAGNOSIS — Z1239 Encounter for other screening for malignant neoplasm of breast: Secondary | ICD-10-CM

## 2018-02-13 DIAGNOSIS — Z1231 Encounter for screening mammogram for malignant neoplasm of breast: Secondary | ICD-10-CM | POA: Insufficient documentation

## 2018-02-15 DIAGNOSIS — M5033 Other cervical disc degeneration, cervicothoracic region: Secondary | ICD-10-CM | POA: Diagnosis not present

## 2018-02-15 DIAGNOSIS — M9901 Segmental and somatic dysfunction of cervical region: Secondary | ICD-10-CM | POA: Diagnosis not present

## 2018-02-15 DIAGNOSIS — M62830 Muscle spasm of back: Secondary | ICD-10-CM | POA: Diagnosis not present

## 2018-02-15 DIAGNOSIS — M9902 Segmental and somatic dysfunction of thoracic region: Secondary | ICD-10-CM | POA: Diagnosis not present

## 2018-02-23 DIAGNOSIS — M3501 Sicca syndrome with keratoconjunctivitis: Secondary | ICD-10-CM | POA: Diagnosis not present

## 2018-07-04 DIAGNOSIS — I8310 Varicose veins of unspecified lower extremity with inflammation: Secondary | ICD-10-CM | POA: Diagnosis not present

## 2018-07-04 DIAGNOSIS — I872 Venous insufficiency (chronic) (peripheral): Secondary | ICD-10-CM | POA: Diagnosis not present

## 2018-07-04 DIAGNOSIS — I8312 Varicose veins of left lower extremity with inflammation: Secondary | ICD-10-CM | POA: Diagnosis not present

## 2018-07-04 DIAGNOSIS — I8311 Varicose veins of right lower extremity with inflammation: Secondary | ICD-10-CM | POA: Diagnosis not present

## 2018-07-04 DIAGNOSIS — L52 Erythema nodosum: Secondary | ICD-10-CM | POA: Diagnosis not present

## 2018-07-04 LAB — CBC AND DIFFERENTIAL
HCT: 43 (ref 36–46)
Hemoglobin: 14.6 (ref 12.0–16.0)
Platelets: 268 (ref 150–399)
WBC: 4.2

## 2018-07-04 LAB — BASIC METABOLIC PANEL
BUN: 15 (ref 4–21)
Creatinine: 0.9 (ref 0.5–1.1)
Glucose: 95
Potassium: 4.2 (ref 3.4–5.3)
Sodium: 144 (ref 137–147)

## 2018-07-04 LAB — VITAMIN D 25 HYDROXY (VIT D DEFICIENCY, FRACTURES): Vit D, 25-Hydroxy: 52.4

## 2018-07-04 LAB — VITAMIN B12: Vitamin B-12: 214

## 2018-07-06 DIAGNOSIS — L52 Erythema nodosum: Secondary | ICD-10-CM | POA: Diagnosis not present

## 2018-07-11 DIAGNOSIS — I8390 Asymptomatic varicose veins of unspecified lower extremity: Secondary | ICD-10-CM | POA: Insufficient documentation

## 2018-07-11 DIAGNOSIS — I83893 Varicose veins of bilateral lower extremities with other complications: Secondary | ICD-10-CM | POA: Diagnosis not present

## 2018-07-11 DIAGNOSIS — M7989 Other specified soft tissue disorders: Secondary | ICD-10-CM | POA: Diagnosis not present

## 2018-07-20 DIAGNOSIS — L52 Erythema nodosum: Secondary | ICD-10-CM | POA: Diagnosis not present

## 2018-08-01 ENCOUNTER — Encounter: Payer: Self-pay | Admitting: Internal Medicine

## 2018-08-01 ENCOUNTER — Ambulatory Visit (INDEPENDENT_AMBULATORY_CARE_PROVIDER_SITE_OTHER): Payer: Medicare HMO | Admitting: Internal Medicine

## 2018-08-01 ENCOUNTER — Other Ambulatory Visit: Payer: Self-pay

## 2018-08-01 VITALS — BP 124/76 | HR 71 | Ht 67.0 in | Wt 175.0 lb

## 2018-08-01 DIAGNOSIS — Z1211 Encounter for screening for malignant neoplasm of colon: Secondary | ICD-10-CM | POA: Diagnosis not present

## 2018-08-01 DIAGNOSIS — I8393 Asymptomatic varicose veins of bilateral lower extremities: Secondary | ICD-10-CM

## 2018-08-01 DIAGNOSIS — N951 Menopausal and female climacteric states: Secondary | ICD-10-CM | POA: Diagnosis not present

## 2018-08-01 DIAGNOSIS — E538 Deficiency of other specified B group vitamins: Secondary | ICD-10-CM | POA: Diagnosis not present

## 2018-08-01 NOTE — Progress Notes (Signed)
Date:  08/01/2018   Name:  Dana Cameron   DOB:  31-Jan-1946   MRN:  [id number]   Chief Complaint: HRT CRC screening - due for colonoscopy.  Last done by Dr. Kristopher Glee in 2005. She plans to call to schedule this for the fall.  HRT - doing well on topical hormone supplements. She has tried to wean off in the past but had severe sx, sleep disturbances.  She uses compounded product from the pharmacy.  Varicose veins with venous stasis - using TAC topically and had consultation with [facility].  Planning bilaterally vein procedures.  B12 def -tested by Derm, recently started on injections.  HPI  Review of Systems  Constitutional: Negative for appetite change, fatigue, fever and unexpected weight change.  HENT: Negative for trouble swallowing.   Eyes: Negative for visual disturbance.  Respiratory: Negative for cough, chest tightness and shortness of breath.   Cardiovascular: Negative for chest pain, palpitations and leg swelling.  Gastrointestinal: Negative for abdominal pain.  Endocrine: Negative for polyuria.  Genitourinary: Negative for dysuria and hematuria.  Musculoskeletal: Negative for arthralgias.  Skin: Positive for color change (from stasis dermatitis).  Neurological: Negative for tremors, numbness and headaches.  Psychiatric/Behavioral: Negative for dysphoric mood and sleep disturbance. The patient is not nervous/anxious.     Patient Active Problem List   Diagnosis Date Noted  . Varicose vein of leg 07/11/2018  . Colon polyp, hyperplastic 01/28/2017  . Arthritis of right hip 01/26/2016  . Hyperlipidemia, mild 03/28/2015  . Fibrocystic breast 09/17/2014  . Environmental and seasonal allergies 09/17/2014  . Pelvic prolapse 09/17/2014  . Hot flash, menopausal 09/17/2014    No Known Allergies  Past Surgical History:  Procedure Laterality Date  . BREAST BIOPSY Left 1993   neg  . COLONOSCOPY  12/31/2003   one hyperplastic polyp  . EXCISION VAGINAL CYST    . TOTAL KNEE  ARTHROPLASTY Right 07/26/2017    Social History   Tobacco Use  . Smoking status: Never Smoker  . Smokeless tobacco: Never Used  Substance Use Topics  . Alcohol use: No    Alcohol/week: 0.0 standard drinks  . Drug use: No     Medication list has been reviewed and updated.  Current Meds  Medication Sig  . cetirizine (ZYRTEC) 10 MG tablet Take 10 mg by mouth daily.  . Cholecalciferol (VITAMIN D3) 50 MCG (2000 UT) TABS Take by mouth.  . Cyanocobalamin (B-12 COMPLIANCE INJECTION IJ) Inject as directed.  . Estradiol-Estriol-Progesterone (BIEST/PROGESTERONE) CREA Place 1 mL onto the skin daily. 80/20 strength  . fluticasone (FLONASE) 50 MCG/ACT nasal spray USE TWO SPRAY(S) IN EACH NOSTRIL ONCE DAILY  . NONFORMULARY OR COMPOUNDED ITEM Progesterone 3.5% HRT - apply 1-2 ml topically daily  . Nutritional Supplements (JUICE PLUS FIBRE PO) Take by mouth.  . Omega 3 1000 MG CAPS Take 1 capsule by mouth daily.  Marland Kitchen triamcinolone cream (KENALOG) 0.1 % APPLY CREAM EXTERNALLY TWICE DAILY TO AFFECTED AREA ON LOWER LEGS    PHQ 2/9 Scores 08/01/2018 01/11/2018 12/30/2016 01/26/2016  PHQ - 2 Score 0 0 0 0    BP Readings from Last 3 Encounters:  08/01/18 124/76  01/30/18 116/74  01/11/18 118/78    Physical Exam Vitals signs and nursing note reviewed.  Constitutional:      General: She is not in acute distress.    Appearance: She is well-developed.  HENT:     Head: Normocephalic and atraumatic.  Neck:     Musculoskeletal: Normal range of  motion.  Cardiovascular:     Rate and Rhythm: Normal rate and regular rhythm.  Pulmonary:     Effort: Pulmonary effort is normal. No respiratory distress.     Breath sounds: No wheezing or rhonchi.  Musculoskeletal:     Right lower leg: No edema.     Left lower leg: No edema.  Lymphadenopathy:     Cervical: No cervical adenopathy.  Skin:    General: Skin is warm and dry.     Capillary Refill: Capillary refill takes less than 2 seconds.     Findings:  No rash.       Neurological:     Mental Status: She is alert and oriented to person, place, and time.  Psychiatric:        Behavior: Behavior normal.        Thought Content: Thought content normal.     Wt Readings from Last 3 Encounters:  08/01/18 175 lb (79.4 kg)  01/30/18 178 lb (80.7 kg)  01/11/18 173 lb (78.5 kg)    BP 124/76   Pulse 71   Ht 5\' 7"  (1.702 m)   Wt 175 lb (79.4 kg)   SpO2 98%   BMI 27.41 kg/m   Assessment and Plan: 1. Hot flash, menopausal Doing well on topical HRT  2. Varicose veins of both lower extremities, unspecified whether complicated Follow up with Vasc Surgery  3. Vitamin B 12 deficiency Now on monthly injections after low values at Dr. Elveria Rising office  4. Colon cancer screening Pt will schedule this fall   Partially dictated using Dragon software. Any errors are unintentional.  Halina Maidens, MD [facility]  08/01/2018

## 2018-08-03 ENCOUNTER — Encounter: Payer: Self-pay | Admitting: Internal Medicine

## 2018-08-03 ENCOUNTER — Other Ambulatory Visit: Payer: Self-pay

## 2018-08-15 DIAGNOSIS — L570 Actinic keratosis: Secondary | ICD-10-CM | POA: Diagnosis not present

## 2018-08-15 DIAGNOSIS — I8311 Varicose veins of right lower extremity with inflammation: Secondary | ICD-10-CM | POA: Diagnosis not present

## 2018-08-15 DIAGNOSIS — I8312 Varicose veins of left lower extremity with inflammation: Secondary | ICD-10-CM | POA: Diagnosis not present

## 2018-08-15 DIAGNOSIS — L52 Erythema nodosum: Secondary | ICD-10-CM | POA: Diagnosis not present

## 2018-08-22 DIAGNOSIS — I83813 Varicose veins of bilateral lower extremities with pain: Secondary | ICD-10-CM | POA: Diagnosis not present

## 2018-08-24 DIAGNOSIS — R51 Headache: Secondary | ICD-10-CM | POA: Diagnosis not present

## 2018-08-24 DIAGNOSIS — H903 Sensorineural hearing loss, bilateral: Secondary | ICD-10-CM | POA: Diagnosis not present

## 2018-08-24 DIAGNOSIS — R42 Dizziness and giddiness: Secondary | ICD-10-CM | POA: Diagnosis not present

## 2018-08-25 DIAGNOSIS — M7989 Other specified soft tissue disorders: Secondary | ICD-10-CM | POA: Diagnosis not present

## 2018-08-25 DIAGNOSIS — I83812 Varicose veins of left lower extremities with pain: Secondary | ICD-10-CM | POA: Diagnosis not present

## 2018-08-29 DIAGNOSIS — I83811 Varicose veins of right lower extremities with pain: Secondary | ICD-10-CM | POA: Diagnosis not present

## 2018-08-29 DIAGNOSIS — M7989 Other specified soft tissue disorders: Secondary | ICD-10-CM | POA: Diagnosis not present

## 2018-08-30 DIAGNOSIS — M5033 Other cervical disc degeneration, cervicothoracic region: Secondary | ICD-10-CM | POA: Diagnosis not present

## 2018-08-30 DIAGNOSIS — M9901 Segmental and somatic dysfunction of cervical region: Secondary | ICD-10-CM | POA: Diagnosis not present

## 2018-08-30 DIAGNOSIS — M62830 Muscle spasm of back: Secondary | ICD-10-CM | POA: Diagnosis not present

## 2018-08-30 DIAGNOSIS — M9902 Segmental and somatic dysfunction of thoracic region: Secondary | ICD-10-CM | POA: Diagnosis not present

## 2018-09-12 DIAGNOSIS — M7989 Other specified soft tissue disorders: Secondary | ICD-10-CM | POA: Diagnosis not present

## 2018-09-12 DIAGNOSIS — I83812 Varicose veins of left lower extremities with pain: Secondary | ICD-10-CM | POA: Diagnosis not present

## 2018-09-15 DIAGNOSIS — M7989 Other specified soft tissue disorders: Secondary | ICD-10-CM | POA: Diagnosis not present

## 2018-09-15 DIAGNOSIS — I83811 Varicose veins of right lower extremities with pain: Secondary | ICD-10-CM | POA: Diagnosis not present

## 2018-09-20 DIAGNOSIS — M5033 Other cervical disc degeneration, cervicothoracic region: Secondary | ICD-10-CM | POA: Diagnosis not present

## 2018-09-20 DIAGNOSIS — M62830 Muscle spasm of back: Secondary | ICD-10-CM | POA: Diagnosis not present

## 2018-09-20 DIAGNOSIS — M9902 Segmental and somatic dysfunction of thoracic region: Secondary | ICD-10-CM | POA: Diagnosis not present

## 2018-09-20 DIAGNOSIS — M9901 Segmental and somatic dysfunction of cervical region: Secondary | ICD-10-CM | POA: Diagnosis not present

## 2018-10-18 DIAGNOSIS — M9901 Segmental and somatic dysfunction of cervical region: Secondary | ICD-10-CM | POA: Diagnosis not present

## 2018-10-18 DIAGNOSIS — M9902 Segmental and somatic dysfunction of thoracic region: Secondary | ICD-10-CM | POA: Diagnosis not present

## 2018-10-18 DIAGNOSIS — M5033 Other cervical disc degeneration, cervicothoracic region: Secondary | ICD-10-CM | POA: Diagnosis not present

## 2018-10-18 DIAGNOSIS — M62830 Muscle spasm of back: Secondary | ICD-10-CM | POA: Diagnosis not present

## 2018-11-15 DIAGNOSIS — M9902 Segmental and somatic dysfunction of thoracic region: Secondary | ICD-10-CM | POA: Diagnosis not present

## 2018-11-15 DIAGNOSIS — M62830 Muscle spasm of back: Secondary | ICD-10-CM | POA: Diagnosis not present

## 2018-11-15 DIAGNOSIS — M9901 Segmental and somatic dysfunction of cervical region: Secondary | ICD-10-CM | POA: Diagnosis not present

## 2018-11-15 DIAGNOSIS — M5033 Other cervical disc degeneration, cervicothoracic region: Secondary | ICD-10-CM | POA: Diagnosis not present

## 2018-11-29 DIAGNOSIS — L821 Other seborrheic keratosis: Secondary | ICD-10-CM | POA: Diagnosis not present

## 2018-11-29 DIAGNOSIS — Z86018 Personal history of other benign neoplasm: Secondary | ICD-10-CM | POA: Diagnosis not present

## 2018-11-29 DIAGNOSIS — L718 Other rosacea: Secondary | ICD-10-CM | POA: Diagnosis not present

## 2018-11-29 DIAGNOSIS — L578 Other skin changes due to chronic exposure to nonionizing radiation: Secondary | ICD-10-CM | POA: Diagnosis not present

## 2018-11-29 DIAGNOSIS — L918 Other hypertrophic disorders of the skin: Secondary | ICD-10-CM | POA: Diagnosis not present

## 2018-11-29 DIAGNOSIS — Z872 Personal history of diseases of the skin and subcutaneous tissue: Secondary | ICD-10-CM | POA: Diagnosis not present

## 2018-12-13 DIAGNOSIS — M5033 Other cervical disc degeneration, cervicothoracic region: Secondary | ICD-10-CM | POA: Diagnosis not present

## 2018-12-13 DIAGNOSIS — M9901 Segmental and somatic dysfunction of cervical region: Secondary | ICD-10-CM | POA: Diagnosis not present

## 2018-12-13 DIAGNOSIS — M62830 Muscle spasm of back: Secondary | ICD-10-CM | POA: Diagnosis not present

## 2018-12-13 DIAGNOSIS — M9902 Segmental and somatic dysfunction of thoracic region: Secondary | ICD-10-CM | POA: Diagnosis not present

## 2018-12-15 DIAGNOSIS — M7989 Other specified soft tissue disorders: Secondary | ICD-10-CM | POA: Diagnosis not present

## 2018-12-15 DIAGNOSIS — I83893 Varicose veins of bilateral lower extremities with other complications: Secondary | ICD-10-CM | POA: Diagnosis not present

## 2018-12-26 DIAGNOSIS — R69 Illness, unspecified: Secondary | ICD-10-CM | POA: Diagnosis not present

## 2018-12-27 DIAGNOSIS — M9902 Segmental and somatic dysfunction of thoracic region: Secondary | ICD-10-CM | POA: Diagnosis not present

## 2018-12-27 DIAGNOSIS — M62830 Muscle spasm of back: Secondary | ICD-10-CM | POA: Diagnosis not present

## 2018-12-27 DIAGNOSIS — M5033 Other cervical disc degeneration, cervicothoracic region: Secondary | ICD-10-CM | POA: Diagnosis not present

## 2018-12-27 DIAGNOSIS — M9901 Segmental and somatic dysfunction of cervical region: Secondary | ICD-10-CM | POA: Diagnosis not present

## 2019-01-03 DIAGNOSIS — M5033 Other cervical disc degeneration, cervicothoracic region: Secondary | ICD-10-CM | POA: Diagnosis not present

## 2019-01-03 DIAGNOSIS — M62830 Muscle spasm of back: Secondary | ICD-10-CM | POA: Diagnosis not present

## 2019-01-03 DIAGNOSIS — R69 Illness, unspecified: Secondary | ICD-10-CM | POA: Diagnosis not present

## 2019-01-03 DIAGNOSIS — M9902 Segmental and somatic dysfunction of thoracic region: Secondary | ICD-10-CM | POA: Diagnosis not present

## 2019-01-03 DIAGNOSIS — M9901 Segmental and somatic dysfunction of cervical region: Secondary | ICD-10-CM | POA: Diagnosis not present

## 2019-01-17 ENCOUNTER — Ambulatory Visit: Payer: Self-pay

## 2019-01-19 ENCOUNTER — Other Ambulatory Visit: Payer: Self-pay

## 2019-01-19 ENCOUNTER — Ambulatory Visit (INDEPENDENT_AMBULATORY_CARE_PROVIDER_SITE_OTHER): Payer: Medicare HMO | Admitting: Internal Medicine

## 2019-01-19 ENCOUNTER — Encounter: Payer: Self-pay | Admitting: Internal Medicine

## 2019-01-19 VITALS — BP 136/82 | HR 83 | Ht 67.0 in | Wt 174.0 lb

## 2019-01-19 DIAGNOSIS — R42 Dizziness and giddiness: Secondary | ICD-10-CM | POA: Diagnosis not present

## 2019-01-19 NOTE — Progress Notes (Signed)
Date:  01/19/2019   Name:  Dana Cameron   DOB:  [birth year]   MRN:  [id number]   Chief Complaint: Dizziness (Had this last year around this time. Feels its vertigo. Nauseous and sick to her stomach. Vomited clear liquids twice. Tuesday and Wednesday morning of this week. )  Dizziness This is a new problem. The current episode started in the past 7 days. The problem occurs every several days. The problem has been unchanged. Associated symptoms include nausea and vomiting. Pertinent negatives include no chest pain, chills, fatigue, fever, headaches, numbness or weakness.    Lab Results  Component Value Date   CREATININE 0.9 07/04/2018   BUN 15 07/04/2018   NA 144 07/04/2018   K 4.2 07/04/2018   CL 104 01/30/2018   CO2 24 01/30/2018   Lab Results  Component Value Date   CHOL 170 01/30/2018   HDL 58 01/30/2018   LDLCALC 84 01/30/2018   TRIG 140 01/30/2018   CHOLHDL 2.9 01/30/2018   Lab Results  Component Value Date   TSH 2.850 01/30/2018   No results found for: HGBA1C   Review of Systems  Constitutional: Negative for chills, fatigue and fever.  Respiratory: Negative for chest tightness and shortness of breath.   Cardiovascular: Negative for chest pain and palpitations.  Gastrointestinal: Positive for nausea and vomiting.  Neurological: Positive for dizziness. Negative for syncope, weakness, numbness and headaches.    Patient Active Problem List   Diagnosis Date Noted  . Vitamin B 12 deficiency 08/01/2018  . Varicose vein of leg 07/11/2018  . Colon polyp, hyperplastic 01/28/2017  . Arthritis of right hip 01/26/2016  . Hyperlipidemia, mild 03/28/2015  . Fibrocystic breast 09/17/2014  . Environmental and seasonal allergies 09/17/2014  . Pelvic prolapse 09/17/2014  . Hot flash, menopausal 09/17/2014    No Known Allergies  Past Surgical History:  Procedure Laterality Date  . BREAST BIOPSY Left 1993   neg  . COLONOSCOPY  12/31/2003   one hyperplastic  polyp  . EXCISION VAGINAL CYST    . TOTAL KNEE ARTHROPLASTY Right 07/26/2017    Social History   Tobacco Use  . Smoking status: Never Smoker  . Smokeless tobacco: Never Used  Substance Use Topics  . Alcohol use: No    Alcohol/week: 0.0 standard drinks  . Drug use: No    Medication list has been reviewed and updated.  Current Meds  Medication Sig  . cetirizine (ZYRTEC) 10 MG tablet Take 10 mg by mouth daily.  . Cyanocobalamin (B-12 COMPLIANCE INJECTION IJ) Inject as directed.  . Estradiol-Estriol-Progesterone (BIEST/PROGESTERONE) CREA Place 1 mL onto the skin daily. 80/20 strength  . fluticasone (FLONASE) 50 MCG/ACT nasal spray USE TWO SPRAY(S) IN EACH NOSTRIL ONCE DAILY  . NONFORMULARY OR COMPOUNDED ITEM Progesterone 3.5% HRT - apply 1-2 ml topically daily  . Nutritional Supplements (JUICE PLUS FIBRE PO) Take by mouth.  . Omega 3 1000 MG CAPS Take 1 capsule by mouth daily.  Marland Kitchen triamcinolone cream (KENALOG) 0.1 % APPLY CREAM EXTERNALLY TWICE DAILY TO AFFECTED AREA ON LOWER LEGS    PHQ 2/9 Scores 01/19/2019 08/01/2018 01/11/2018 12/30/2016  PHQ - 2 Score 0 0 0 0    BP Readings from Last 3 Encounters:  01/19/19 136/82  08/01/18 124/76  01/30/18 116/74    Physical Exam Vitals signs and nursing note reviewed.  Constitutional:      General: She is not in acute distress (moves carefully to avoid triggering vertigo).    Appearance: Normal  appearance. She is well-developed.  HENT:     Head: Normocephalic and atraumatic.  Eyes:     Extraocular Movements:     Right eye: No nystagmus.     Left eye: No nystagmus.  Neck:     Musculoskeletal: Normal range of motion.  Cardiovascular:     Rate and Rhythm: Normal rate and regular rhythm.     Pulses: Normal pulses.     Heart sounds: No murmur.  Pulmonary:     Effort: Pulmonary effort is normal. No respiratory distress.     Breath sounds: No wheezing or rhonchi.  Musculoskeletal: Normal range of motion.  Lymphadenopathy:      Cervical: No cervical adenopathy.  Skin:    General: Skin is warm and dry.     Findings: No rash.  Neurological:     Mental Status: She is alert and oriented to person, place, and time.     Motor: Motor function is intact.     Coordination: Finger-Nose-Finger Test abnormal.  Psychiatric:        Attention and Perception: Attention normal.        Mood and Affect: Mood normal.        Behavior: Behavior normal.        Thought Content: Thought content normal.     Wt Readings from Last 3 Encounters:  01/19/19 174 lb (78.9 kg)  08/01/18 175 lb (79.4 kg)  01/30/18 178 lb (80.7 kg)    BP 136/82 (BP Location: Right Arm, Patient Position: Sitting, Cuff Size: Large)   Pulse 83   Ht 5\' 7"  (1.702 m)   Wt 174 lb (78.9 kg)   SpO2 97%   BMI 27.25 kg/m   Assessment and Plan: 1. Vertigo Improving Recommend Dramamine bid Avoid quick head movements Call if no improvement in one week   Partially dictated using Editor, commissioning. Any errors are unintentional.  Halina Maidens, MD [facility]  01/19/2019

## 2019-01-19 NOTE — Patient Instructions (Signed)
Use Dramamine twice a day for several days to control the symptoms.

## 2019-01-22 ENCOUNTER — Ambulatory Visit: Payer: Medicare HMO | Admitting: Internal Medicine

## 2019-01-30 ENCOUNTER — Other Ambulatory Visit: Payer: Self-pay | Admitting: Internal Medicine

## 2019-01-30 DIAGNOSIS — Z1231 Encounter for screening mammogram for malignant neoplasm of breast: Secondary | ICD-10-CM

## 2019-01-31 ENCOUNTER — Telehealth: Payer: Self-pay

## 2019-01-31 ENCOUNTER — Encounter: Payer: Self-pay | Admitting: Internal Medicine

## 2019-01-31 ENCOUNTER — Ambulatory Visit (INDEPENDENT_AMBULATORY_CARE_PROVIDER_SITE_OTHER): Payer: Medicare HMO

## 2019-01-31 VITALS — Ht 67.0 in | Wt 170.0 lb

## 2019-01-31 DIAGNOSIS — M62830 Muscle spasm of back: Secondary | ICD-10-CM | POA: Diagnosis not present

## 2019-01-31 DIAGNOSIS — M9901 Segmental and somatic dysfunction of cervical region: Secondary | ICD-10-CM | POA: Diagnosis not present

## 2019-01-31 DIAGNOSIS — M9902 Segmental and somatic dysfunction of thoracic region: Secondary | ICD-10-CM | POA: Diagnosis not present

## 2019-01-31 DIAGNOSIS — Z0000 Encounter for general adult medical examination without abnormal findings: Secondary | ICD-10-CM | POA: Diagnosis not present

## 2019-01-31 DIAGNOSIS — Z20828 Contact with and (suspected) exposure to other viral communicable diseases: Secondary | ICD-10-CM | POA: Diagnosis not present

## 2019-01-31 DIAGNOSIS — Z780 Asymptomatic menopausal state: Secondary | ICD-10-CM

## 2019-01-31 DIAGNOSIS — M5033 Other cervical disc degeneration, cervicothoracic region: Secondary | ICD-10-CM | POA: Diagnosis not present

## 2019-01-31 NOTE — Progress Notes (Signed)
Subjective:   Dana Cameron is a 73 y.o. female who presents for Medicare Annual (Subsequent) preventive examination.  Virtual Visit via Telephone Note  I connected with Dana Cameron on 01/31/19 at  8:40 AM EST by telephone and verified that I am speaking with the correct person using two identifiers.  Medicare Annual Wellness visit completed telephonically due to Covid-19 pandemic.   Location: Patient: home Provider: office   I discussed the limitations, risks, security and privacy concerns of performing an evaluation and management service by telephone and the availability of in person appointments. The patient expressed understanding and agreed to proceed.  Some vital signs may be absent or patient reported.   Clemetine Marker, LPN    Review of Systems:   Cardiac Risk Factors include: advanced age (>70men, >63 women)     Objective:     Vitals: Ht 5\' 7"  (1.702 m)   Wt 170 lb (77.1 kg)   BMI 26.63 kg/m   Body mass index is 26.63 kg/m.  Advanced Directives 01/31/2019 01/11/2018 12/30/2016 01/26/2016  Does Patient Have a Medical Advance Directive? Yes Yes Yes Yes  Type of Paramedic of [place];Living will [place];Living will [place];Living will Living will  Copy of [place] in Chart? No - copy requested No - copy requested No - copy requested -    Tobacco Social History   Tobacco Use  Smoking Status Never Smoker  Smokeless Tobacco Never Used     Counseling given: Not Answered   Clinical Intake:  Pre-visit preparation completed: Yes  Pain : No/denies pain     BMI - recorded: 26.63 Nutritional Status: BMI 25 -29 Overweight Nutritional Risks: None Diabetes: No  How often do you need to have someone help you when you read instructions, pamphlets, or other written materials from your doctor or pharmacy?: 1 - Never  Interpreter Needed?: No  Information  entered by :: Clemetine Marker LPN  Past Medical History:  Diagnosis Date  . Allergy    Fall Seasonal Allergies  . Arthritis of right hip   . Cataract   . Hyperlipidemia   . Vaginal prolapse    Past Surgical History:  Procedure Laterality Date  . BREAST BIOPSY Left 1993   neg  . COLONOSCOPY  12/31/2003   one hyperplastic polyp  . EXCISION VAGINAL CYST    . TOTAL KNEE ARTHROPLASTY Right 07/26/2017   Family History  Problem Relation Age of Onset  . Transient ischemic attack Mother   . Hypertension Father   . Stroke Father   . Asthma Sister   . Lung cancer Brother   . Cancer Brother   . Varicose Veins Daughter   . Breast cancer Neg Hx    Social History   Socioeconomic History  . Marital status: Married    Spouse name: Not on file  . Number of children: 2  . Years of education: 55  . Highest education level: High school graduate  Occupational History  . Occupation: retired  Scientific laboratory technician  . Financial resource strain: Not hard at all  . Food insecurity    Worry: Never true    Inability: Never true  . Transportation needs    Medical: No    Non-medical: No  Tobacco Use  . Smoking status: Never Smoker  . Smokeless tobacco: Never Used  Substance and Sexual Activity  . Alcohol use: No    Alcohol/week: 0.0 standard drinks  . Drug use:  No  . Sexual activity: Not Currently    Birth control/protection: None  Lifestyle  . Physical activity    Days per week: 0 days    Minutes per session: 0 min  . Stress: Not at all  Relationships  . Social connections    Talks on phone: More than three times a week    Gets together: Three times a week    Attends religious service: 1 to 4 times per year    Active member of club or organization: No    Attends meetings of clubs or organizations: Never    Relationship status: Married  Other Topics Concern  . Not on file  Social History Narrative   Pt lives in [place] January, February and March each year, plays golf there     Outpatient Encounter Medications as of 01/31/2019  Medication Sig  . Cyanocobalamin (B-12 COMPLIANCE INJECTION IJ) Inject as directed.  . Estradiol-Estriol-Progesterone (BIEST/PROGESTERONE) CREA Place 1 mL onto the skin daily. 80/20 strength  . fexofenadine (ALLEGRA) 180 MG tablet Take 180 mg by mouth daily.  . fluticasone (FLONASE) 50 MCG/ACT nasal spray USE TWO SPRAY(S) IN EACH NOSTRIL ONCE DAILY  . NONFORMULARY OR COMPOUNDED ITEM Progesterone 3.5% HRT - apply 1-2 ml topically daily  . Nutritional Supplements (JUICE PLUS FIBRE PO) Take by mouth.  . Omega 3 1000 MG CAPS Take 1 capsule by mouth daily. Juice plus brand with 5 omegas  . [DISCONTINUED] cetirizine (ZYRTEC) 10 MG tablet Take 10 mg by mouth daily.  . [DISCONTINUED] triamcinolone cream (KENALOG) 0.1 % APPLY CREAM EXTERNALLY TWICE DAILY TO AFFECTED AREA ON LOWER LEGS   No facility-administered encounter medications on file as of 01/31/2019.     Activities of Daily Living In your present state of health, do you have any difficulty performing the following activities: 01/31/2019 01/19/2019  Hearing? N N  Comment declines hearing aids -  Vision? N N  Difficulty concentrating or making decisions? N N  Walking or climbing stairs? N N  Dressing or bathing? N N  Doing errands, shopping? N N  Preparing Food and eating ? N -  Using the Toilet? N -  In the past six months, have you accidently leaked urine? N -  Do you have problems with loss of bowel control? N -  Managing your Medications? N -  Managing your Finances? N -  Housekeeping or managing your Housekeeping? N -  Some recent data might be hidden    Patient Care Team: Glean Hess, MD as PCP - General (Internal Medicine) Kirk Ruths, MD (Gastroenterology) Dr. Tyler Deis (Vascular Surgery) Jannet Mantis, MD (Dermatology)    Assessment:   This is a routine wellness examination for [place].  Exercise Activities and Dietary recommendations Current  Exercise Habits: The patient does not participate in regular exercise at present, Exercise limited by: None identified  Goals    . Exercise 150 minutes per week (moderate activity) (pt-stated)     Pt states she would like to walk or do some strengthening exercise, 150 minutes per week.       Fall Risk Fall Risk  01/31/2019 08/01/2018 01/11/2018 12/30/2016 01/26/2016  Falls in the past year? 0 0 0 No No  Number falls in past yr: 0 0 0 - -  Injury with Fall? 0 0 - - -  Follow up Falls prevention discussed Falls evaluation completed - - -   FALL RISK PREVENTION PERTAINING TO THE HOME:  Any stairs in or around the  home? Yes  If so, do they handrails? No   Home free of loose throw rugs in walkways, pet beds, electrical cords, etc? Yes  Adequate lighting in your home to reduce risk of falls? Yes   ASSISTIVE DEVICES UTILIZED TO PREVENT FALLS:  Life alert? No  Use of a cane, walker or w/c? No  Grab bars in the bathroom? No  Shower chair or bench in shower? No  Elevated toilet seat or a handicapped toilet? No  DME ORDERS:  DME order needed?  No   TIMED UP AND GO:  Was the test performed? No . Telephonic visit.   Education: Fall risk prevention has been discussed.  Intervention(s) required? Yes  - needs handrails for inside steps to upstairs.    Depression Screen PHQ 2/9 Scores 01/31/2019 01/19/2019 08/01/2018 01/11/2018  PHQ - 2 Score 0 0 0 0     Cognitive Function     6CIT Screen 01/31/2019 01/11/2018 12/30/2016 01/26/2016  What Year? 0 points 0 points 0 points 0 points  What month? 0 points 0 points 0 points 0 points  What time? 0 points 0 points 0 points 0 points  Count back from 20 0 points 0 points 0 points 0 points  Months in reverse 0 points 0 points 0 points 0 points  Repeat phrase 0 points 0 points 0 points 0 points  Total Score 0 0 0 0    Immunization History  Administered Date(s) Administered  . Fluad Quad(high Dose 65+) 12/26/2018  . Influenza, High Dose  Seasonal PF 12/30/2016, 02/07/2018  . Influenza, Seasonal, Injecte, Preservative Fre 12/15/2011  . Influenza,inj,Quad PF,6+ Mos 12/15/2012, 12/18/2013, 11/21/2014, 01/26/2016  . Influenza-Unspecified 02/07/2018  . Pneumococcal Conjugate-13 01/21/2015  . Pneumococcal Polysaccharide-23 08/24/2012  . Tdap 08/25/2009  . Zoster 06/22/2010    Qualifies for Shingles Vaccine? Yes  Zostavax completed 2012. Due for Shingrix. Education has been provided regarding the importance of this vaccine. Pt has been advised to call insurance company to determine out of pocket expense. Advised may also receive vaccine at local pharmacy or Health Dept. Verbalized acceptance and understanding.  Tdap: Up to date  Flu Vaccine: Up to date  Pneumococcal Vaccine: Up to date   Screening Tests Health Maintenance  Topic Date Due  . COLONOSCOPY  12/30/2013  . MAMMOGRAM  02/14/2019  . TETANUS/TDAP  08/26/2019  . INFLUENZA VACCINE  Completed  . PNA vac Low Risk Adult  Completed  . DEXA SCAN  Addressed  . Hepatitis C Screening  Addressed    Cancer Screenings:  Colorectal Screening: Completed 2005. Repeat every 10 years; Declined referral to GI for repeat screening due to current pandemic.   Mammogram: Completed 02/13/18. Repeat every year; Scheduled for 02/15/19.   Bone Density: Completed 12/25/13. Results reflect OSTEOPENIA. Repeat every 2 years. Ordered today. Pt provided with contact information and advised to call to schedule appt.   Lung Cancer Screening: (Low Dose CT Chest recommended if Age 82-80 years, 30 pack-year currently smoking OR have quit w/in 15years.) does not qualify.    Additional Screening:  Hepatitis C Screening: does qualify; Completed 11/29/14  Vision Screening: Recommended annual ophthalmology exams for early detection of glaucoma and other disorders of the eye. Is the patient up to date with their annual eye exam?  Yes  Who is the provider or what is the name of the office in  which the pt attends annual eye exams? [facility]  Dental Screening: Recommended annual dental exams for proper oral hygiene  Community Resource Referral:  CRR required this visit?  No       Plan:     I have personally reviewed and addressed the Medicare Annual Wellness questionnaire and have noted the following in the patient's chart:  A. Medical and social history B. Use of alcohol, tobacco or illicit drugs  C. Current medications and supplements D. Functional ability and status E.  Nutritional status F.  Physical activity G. Advance directives H. List of other physicians I.  Hospitalizations, surgeries, and ER visits in previous 12 months J.  [place] such as hearing and vision if needed, cognitive and depression L. Referrals and appointments   In addition, I have reviewed and discussed with patient certain preventive protocols, quality metrics, and best practice recommendations. A written personalized care plan for preventive services as well as general preventive health recommendations were provided to patient.   Signed,  Clemetine Marker, LPN Nurse Health Advisor   Nurse Notes: pt c/o nasal congestion and feeling stuffy. Denies fever or productive cough. Taking sudafed and flonase with relief. Scheduled for CPE with Dr. Army Melia tomorrow.

## 2019-01-31 NOTE — Patient Instructions (Signed)
Dana Cameron , Thank you for taking time to come for your Medicare Wellness Visit. I appreciate your ongoing commitment to your health goals. Please review the following plan we discussed and let me know if I can assist you in the future.   Screening recommendations/referrals: Colonoscopy: done 2005.  Mammogram: done 02/13/18. Scheduled for 02/15/19.  Bone Density: done 12/25/13. Please call [phone] to schedule your bone density screening.  Recommended yearly ophthalmology/optometry visit for glaucoma screening and checkup Recommended yearly dental visit for hygiene and checkup  Vaccinations: Influenza vaccine: done 12/26/18 Pneumococcal vaccine: done 01/21/15 Tdap vaccine: done 08/25/09 Shingles vaccine: Shingrix discussed. Please contact your pharmacy for coverage information.   Advanced directives: Please bring a copy of your health care power of attorney and living will to the office at your convenience.  Conditions/risks identified: Recommend increasing physical activity to at least 3 days per week.   Next appointment: Please follow up in one year for your Medicare Annual Wellness visit.     Preventive Care 61 Years and Older, Female Preventive care refers to lifestyle choices and visits with your health care provider that can promote health and wellness. What does preventive care include?  A yearly physical exam. This is also called an annual well check.  Dental exams once or twice a year.  Routine eye exams. Ask your health care provider how often you should have your eyes checked.  Personal lifestyle choices, including:  Daily care of your teeth and gums.  Regular physical activity.  Eating a healthy diet.  Avoiding tobacco and drug use.  Limiting alcohol use.  Practicing safe sex.  Taking low-dose aspirin every day.  Taking vitamin and mineral supplements as recommended by your health care provider. What happens during an annual well check? The  services and screenings done by your health care provider during your annual well check will depend on your age, overall health, lifestyle risk factors, and family history of disease. Counseling  Your health care provider may ask you questions about your:  Alcohol use.  Tobacco use.  Drug use.  Emotional well-being.  Home and relationship well-being.  Sexual activity.  Eating habits.  History of falls.  Memory and ability to understand (cognition).  Work and work Statistician.  Reproductive health. Screening  You may have the following tests or measurements:  Height, weight, and BMI.  Blood pressure.  Lipid and cholesterol levels. These may be checked every 5 years, or more frequently if you are over 45 years old.  Skin check.  Lung cancer screening. You may have this screening every year starting at age 33 if you have a 30-pack-year history of smoking and currently smoke or have quit within the past 15 years.  Fecal occult blood test (FOBT) of the stool. You may have this test every year starting at age 30.  Flexible sigmoidoscopy or colonoscopy. You may have a sigmoidoscopy every 5 years or a colonoscopy every 10 years starting at age 39.  Hepatitis C blood test.  Hepatitis B blood test.  Sexually transmitted disease (STD) testing.  Diabetes screening. This is done by checking your blood sugar (glucose) after you have not eaten for a while (fasting). You may have this done every 1-3 years.  Bone density scan. This is done to screen for osteoporosis. You may have this done starting at age 2.  Mammogram. This may be done every 1-2 years. Talk to your health care provider about how often you should have regular mammograms. Talk with your health  care provider about your test results, treatment options, and if necessary, the need for more tests. Vaccines  Your health care provider may recommend certain vaccines, such as:  Influenza vaccine. This is recommended  every year.  Tetanus, diphtheria, and acellular pertussis (Tdap, Td) vaccine. You may need a Td booster every 10 years.  Zoster vaccine. You may need this after age 1.  Pneumococcal 13-valent conjugate (PCV13) vaccine. One dose is recommended after age 48.  Pneumococcal polysaccharide (PPSV23) vaccine. One dose is recommended after age 21. Talk to your health care provider about which screenings and vaccines you need and how often you need them. This information is not intended to replace advice given to you by your health care provider. Make sure you discuss any questions you have with your health care provider. Document Released: 03/14/2015 Document Revised: 11/05/2015 Document Reviewed: 12/17/2014 Elsevier Interactive Patient Education  2017 [place] Prevention in the Home Falls can cause injuries. They can happen to people of all ages. There are many things you can do to make your home safe and to help prevent falls. What can I do on the outside of my home?  Regularly fix the edges of walkways and driveways and fix any cracks.  Remove anything that might make you trip as you walk through a door, such as a raised step or threshold.  Trim any bushes or trees on the path to your home.  Use bright outdoor lighting.  Clear any walking paths of anything that might make someone trip, such as rocks or tools.  Regularly check to see if handrails are loose or broken. Make sure that both sides of any steps have handrails.  Any raised decks and porches should have guardrails on the edges.  Have any leaves, snow, or ice cleared regularly.  Use sand or salt on walking paths during winter.  Clean up any spills in your garage right away. This includes oil or grease spills. What can I do in the bathroom?  Use night lights.  Install grab bars by the toilet and in the tub and shower. Do not use towel bars as grab bars.  Use non-skid mats or decals in the tub or shower.  If  you need to sit down in the shower, use a plastic, non-slip stool.  Keep the floor dry. Clean up any water that spills on the floor as soon as it happens.  Remove soap buildup in the tub or shower regularly.  Attach bath mats securely with double-sided non-slip rug tape.  Do not have throw rugs and other things on the floor that can make you trip. What can I do in the bedroom?  Use night lights.  Make sure that you have a light by your bed that is easy to reach.  Do not use any sheets or blankets that are too big for your bed. They should not hang down onto the floor.  Have a firm chair that has side arms. You can use this for support while you get dressed.  Do not have throw rugs and other things on the floor that can make you trip. What can I do in the kitchen?  Clean up any spills right away.  Avoid walking on wet floors.  Keep items that you use a lot in easy-to-reach places.  If you need to reach something above you, use a strong step stool that has a grab bar.  Keep electrical cords out of the way.  Do not use floor  polish or wax that makes floors slippery. If you must use wax, use non-skid floor wax.  Do not have throw rugs and other things on the floor that can make you trip. What can I do with my stairs?  Do not leave any items on the stairs.  Make sure that there are handrails on both sides of the stairs and use them. Fix handrails that are broken or loose. Make sure that handrails are as long as the stairways.  Check any carpeting to make sure that it is firmly attached to the stairs. Fix any carpet that is loose or worn.  Avoid having throw rugs at the top or bottom of the stairs. If you do have throw rugs, attach them to the floor with carpet tape.  Make sure that you have a light switch at the top of the stairs and the bottom of the stairs. If you do not have them, ask someone to add them for you. What else can I do to help prevent falls?  Wear shoes  that:  Do not have high heels.  Have rubber bottoms.  Are comfortable and fit you well.  Are closed at the toe. Do not wear sandals.  If you use a stepladder:  Make sure that it is fully opened. Do not climb a closed stepladder.  Make sure that both sides of the stepladder are locked into place.  Ask someone to hold it for you, if possible.  Clearly mark and make sure that you can see:  Any grab bars or handrails.  First and last steps.  Where the edge of each step is.  Use tools that help you move around (mobility aids) if they are needed. These include:  Canes.  Walkers.  Scooters.  Crutches.  Turn on the lights when you go into a dark area. Replace any light bulbs as soon as they burn out.  Set up your furniture so you have a clear path. Avoid moving your furniture around.  If any of your floors are uneven, fix them.  If there are any pets around you, be aware of where they are.  Review your medicines with your doctor. Some medicines can make you feel dizzy. This can increase your chance of falling. Ask your doctor what other things that you can do to help prevent falls. This information is not intended to replace advice given to you by your health care provider. Make sure you discuss any questions you have with your health care provider. Document Released: 12/12/2008 Document Revised: 07/24/2015 Document Reviewed: 03/22/2014 Elsevier Interactive Patient Education  2017 [employer].

## 2019-01-31 NOTE — Telephone Encounter (Signed)
Patient called to reschedule her appt for tomorrow because her husband just tested positive for Covid. She was tested today and is just awaiting results. She does not have any sick symptoms at this time.

## 2019-02-01 ENCOUNTER — Encounter: Payer: Self-pay | Admitting: Internal Medicine

## 2019-02-03 ENCOUNTER — Encounter: Payer: Self-pay | Admitting: Internal Medicine

## 2019-02-15 ENCOUNTER — Ambulatory Visit
Admission: RE | Admit: 2019-02-15 | Discharge: 2019-02-15 | Disposition: A | Discharge status: Home / Self Care | Payer: Medicare HMO | Source: Ambulatory Visit | Attending: Internal Medicine | Admitting: Internal Medicine

## 2019-02-15 ENCOUNTER — Other Ambulatory Visit: Payer: Self-pay

## 2019-02-15 DIAGNOSIS — Z1231 Encounter for screening mammogram for malignant neoplasm of breast: Secondary | ICD-10-CM | POA: Insufficient documentation

## 2019-02-20 ENCOUNTER — Ambulatory Visit
Admission: RE | Admit: 2019-02-20 | Discharge: 2019-02-20 | Disposition: A | Discharge status: Home / Self Care | Payer: Medicare HMO | Source: Ambulatory Visit | Attending: Internal Medicine | Admitting: Internal Medicine

## 2019-02-20 ENCOUNTER — Other Ambulatory Visit: Payer: Self-pay

## 2019-02-20 DIAGNOSIS — Z780 Asymptomatic menopausal state: Secondary | ICD-10-CM

## 2019-02-20 DIAGNOSIS — M8589 Other specified disorders of bone density and structure, multiple sites: Secondary | ICD-10-CM | POA: Diagnosis not present

## 2019-02-27 DIAGNOSIS — M5033 Other cervical disc degeneration, cervicothoracic region: Secondary | ICD-10-CM | POA: Diagnosis not present

## 2019-02-27 DIAGNOSIS — M9901 Segmental and somatic dysfunction of cervical region: Secondary | ICD-10-CM | POA: Diagnosis not present

## 2019-02-27 DIAGNOSIS — M62830 Muscle spasm of back: Secondary | ICD-10-CM | POA: Diagnosis not present

## 2019-02-27 DIAGNOSIS — M9902 Segmental and somatic dysfunction of thoracic region: Secondary | ICD-10-CM | POA: Diagnosis not present

## 2019-02-27 DIAGNOSIS — H2511 Age-related nuclear cataract, right eye: Secondary | ICD-10-CM | POA: Diagnosis not present

## 2019-05-21 DIAGNOSIS — D489 Neoplasm of uncertain behavior, unspecified: Secondary | ICD-10-CM | POA: Diagnosis not present

## 2019-05-21 DIAGNOSIS — D229 Melanocytic nevi, unspecified: Secondary | ICD-10-CM | POA: Diagnosis not present

## 2019-05-21 DIAGNOSIS — C44319 Basal cell carcinoma of skin of other parts of face: Secondary | ICD-10-CM | POA: Diagnosis not present

## 2019-05-27 IMAGING — MG MM DIGITAL SCREENING BILAT W/ TOMO W/ CAD
8 of 14 series · 8 of 30 positions shown · non-contrast
Comparison: Previous exam(s).

CLINICAL DATA: Screening.

EXAM:
2D DIGITAL SCREENING BILATERAL MAMMOGRAM WITH CAD AND ADJUNCT TOMO

[L CC synth-2D]
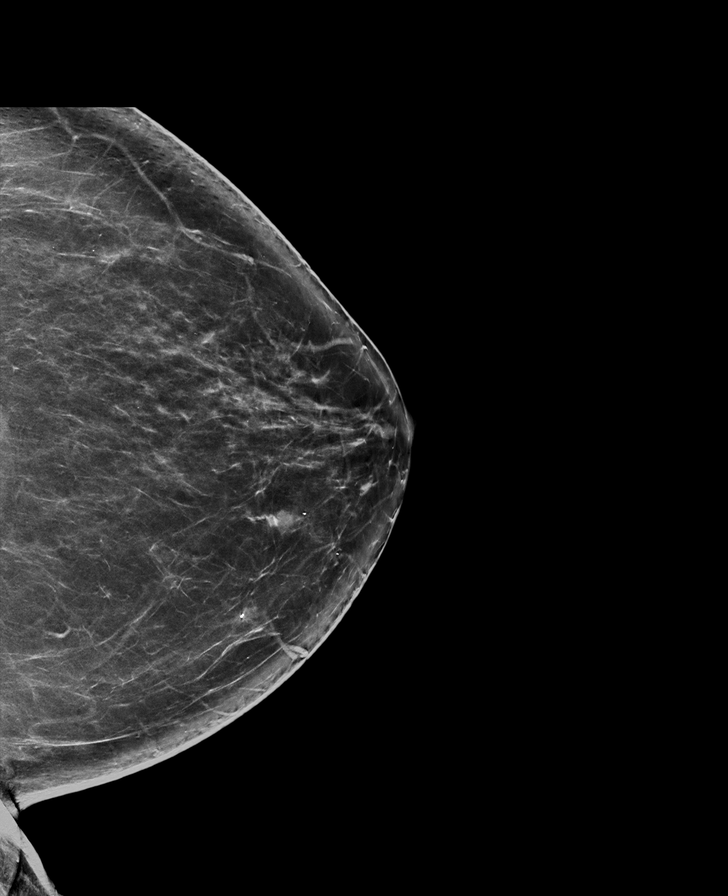

[R MLO synth-2D]
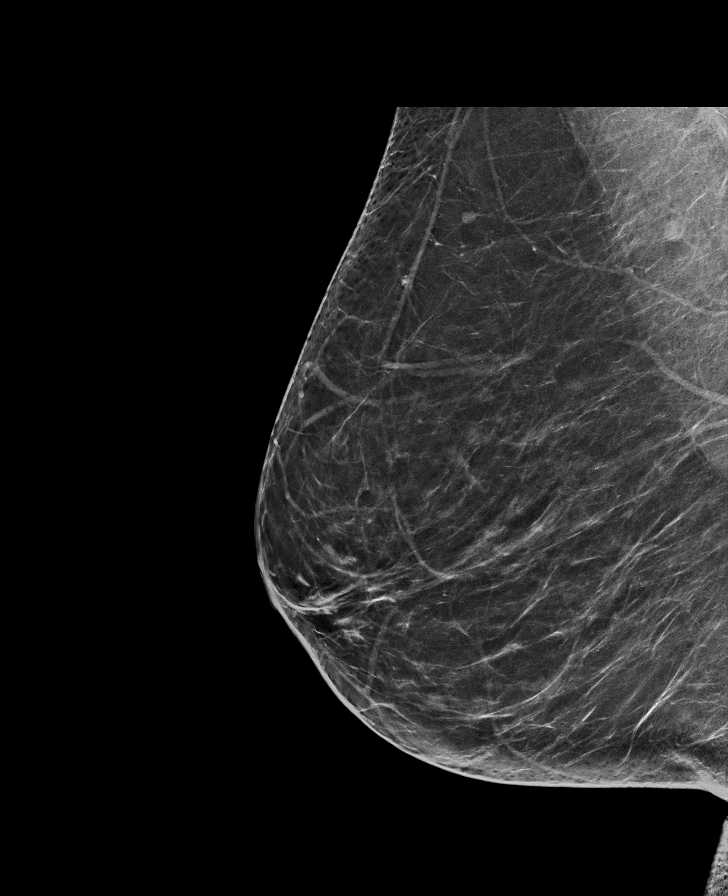

[R CC]
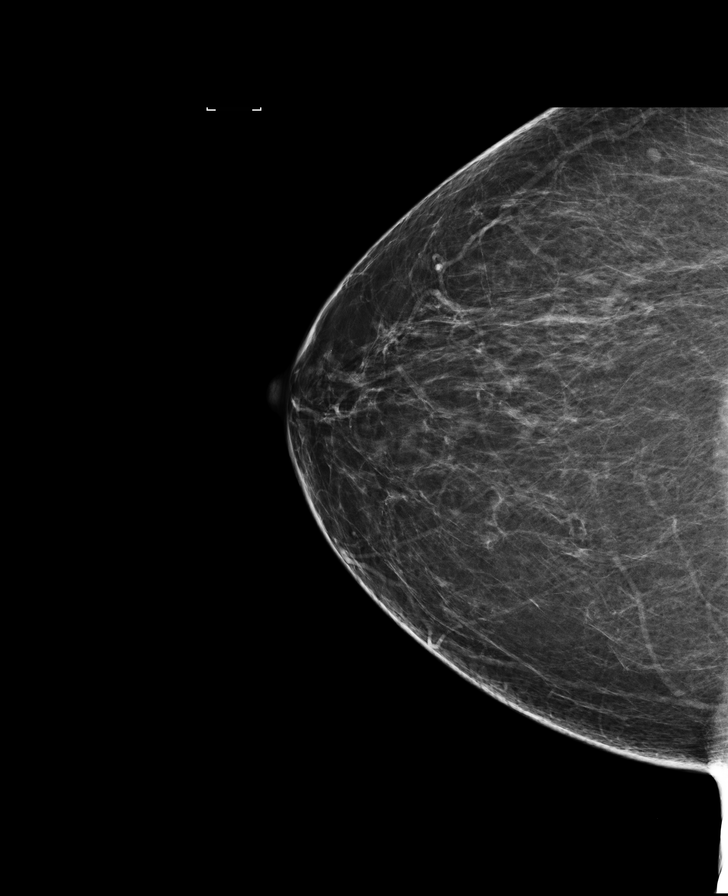

[R CC synth-2D]
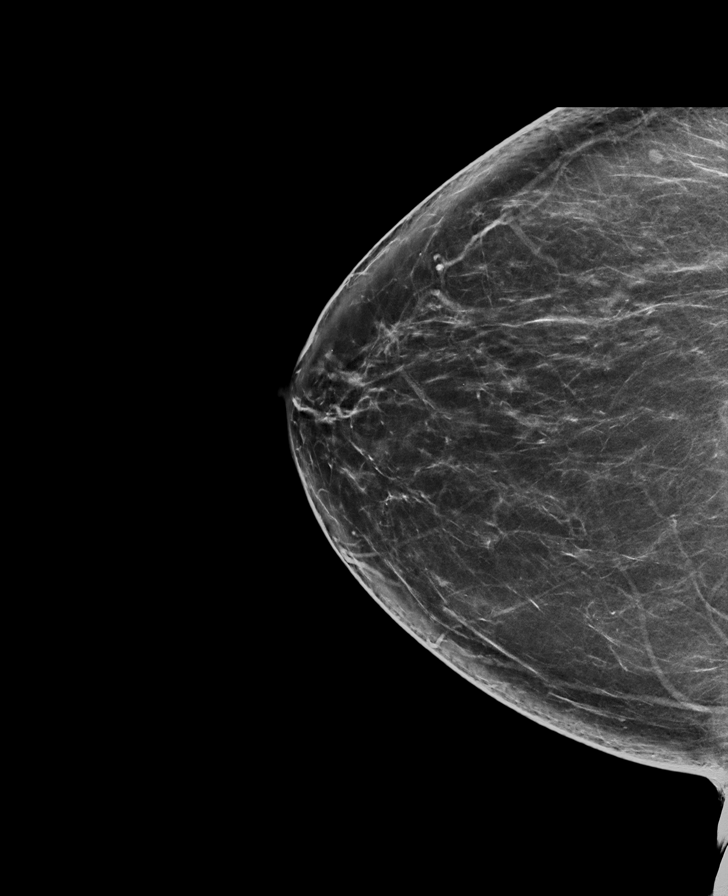

[L MLO synth-2D]
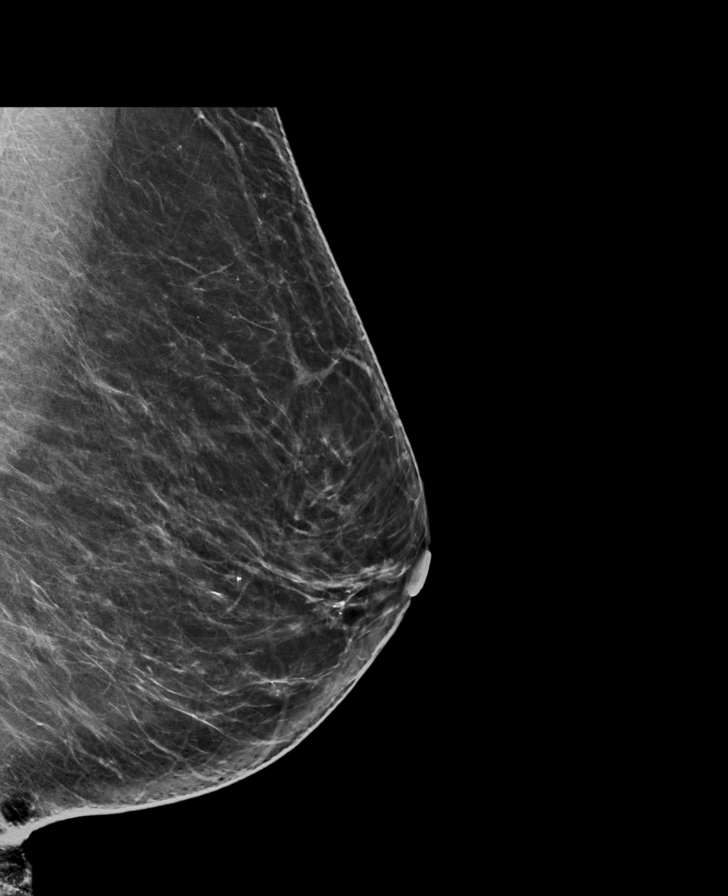

[R MLO]
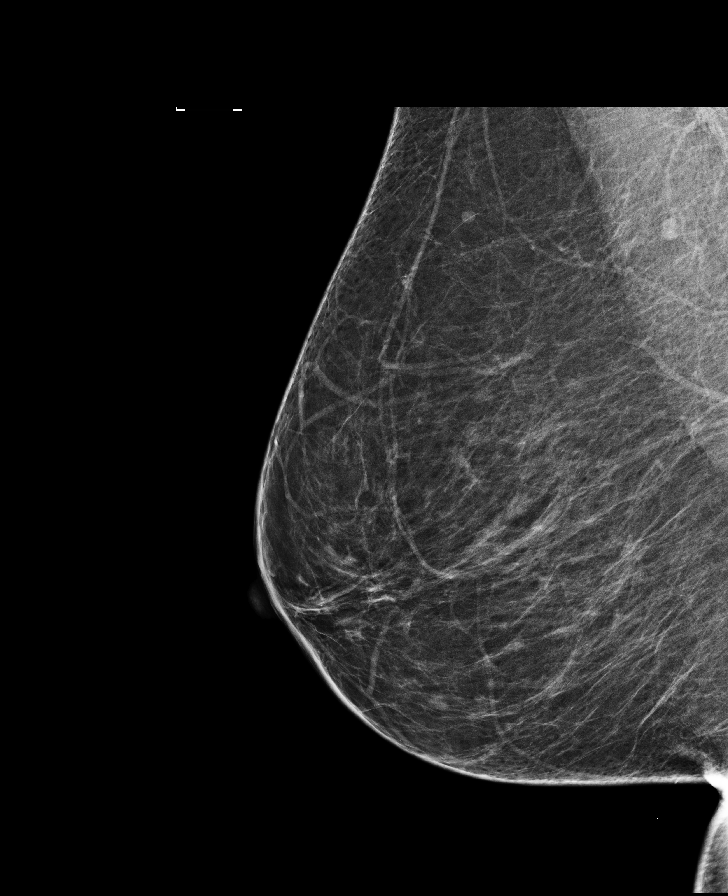

[L CC]
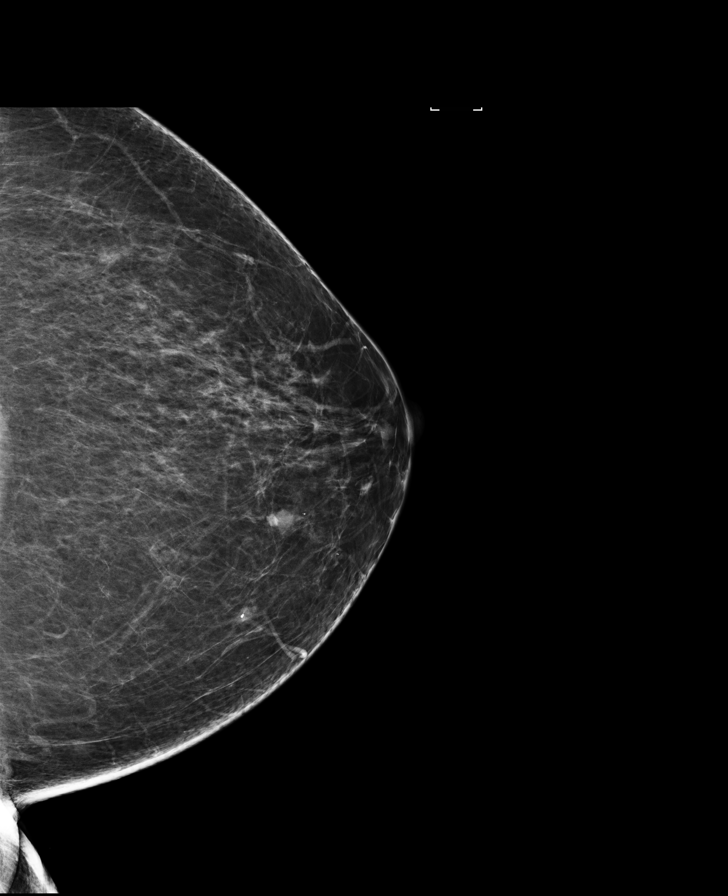

[L MLO]
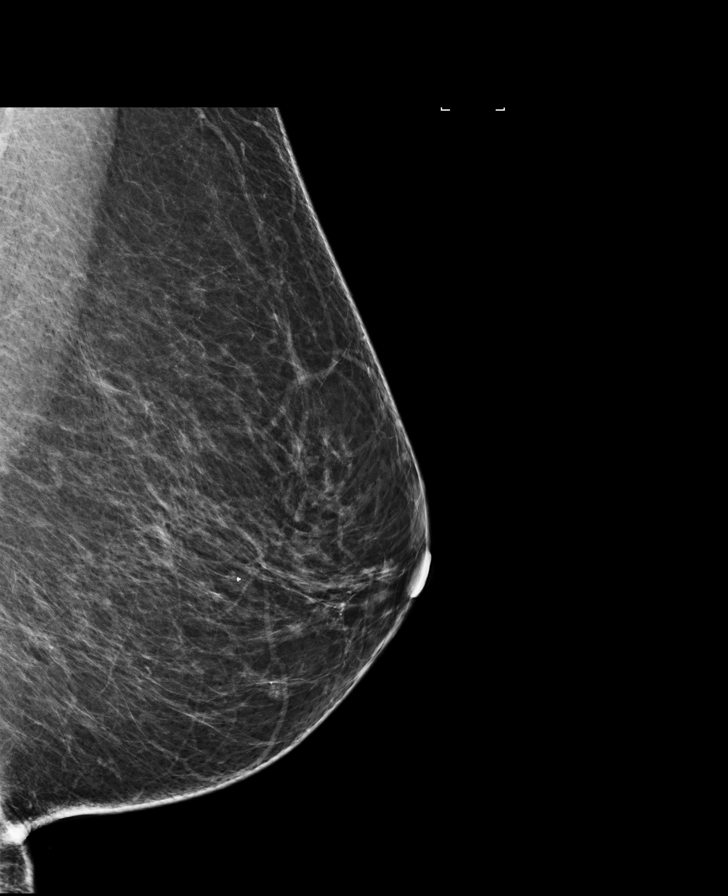

[8 of 30 positions shown; findings below may reference images not displayed]

ACR Breast Density Category b: There are scattered areas of
fibroglandular density.
FINDINGS: In the right breast, a possible asymmetry warrants further
evaluation. In the left breast, no findings suspicious for
malignancy. Images were processed with CAD.
IMPRESSION: Further evaluation is suggested for possible asymmetry in the right
breast.

RECOMMENDATION:
Diagnostic mammogram and possibly ultrasound of the right breast.
(Code:[id number])

The patient will be contacted regarding the findings, and additional
imaging will be scheduled.

BI-RADS CATEGORY  0: Incomplete. Need additional imaging evaluation
and/or prior mammograms for comparison.

## 2019-06-12 ENCOUNTER — Other Ambulatory Visit: Payer: Self-pay | Admitting: Internal Medicine

## 2019-06-12 ENCOUNTER — Telehealth: Payer: Self-pay

## 2019-06-12 NOTE — Telephone Encounter (Signed)
Patient pharmacy called saying patient takes Estradiol-Estriol-Progesterone (BIEST/PROGESTERONE) CREAM and : Progesterone 3.5% HRT - apply 1-2 ml topically daily. Pharmacy wants to know if you want them to continue to separate the medications or do you want them to add these two in a combined compounded medication.  Pls advise.

## 2019-06-12 NOTE — Telephone Encounter (Signed)
I never initiated the prescription.  I merely agreed to refill it.  They should continue to do whatever they have done in the past.

## 2019-06-13 NOTE — Telephone Encounter (Signed)
Called and let [name] know to keep the drugs separate.

## 2019-06-21 ENCOUNTER — Ambulatory Visit: Payer: Medicare HMO | Attending: Internal Medicine

## 2019-06-21 DIAGNOSIS — Z23 Encounter for immunization: Secondary | ICD-10-CM

## 2019-06-21 NOTE — Progress Notes (Signed)
   Covid-19 Vaccination Clinic  Name:  Shiri Vanhuss    MRN: [id number] DOB: 03/30/1945  06/21/2019  Ms. Baty was observed post Covid-19 immunization for 15 minutes without incident. She was provided with Vaccine Information Sheet and instruction to access the V-Safe system.   Ms. Peralta was instructed to call 911 with any severe reactions post vaccine: Marland Kitchen Difficulty breathing  . Swelling of face and throat  . A fast heartbeat  . A bad rash all over body  . Dizziness and weakness   Immunizations Administered    Name Date Dose VIS Date Route   Pfizer COVID-19 Vaccine 06/21/2019  9:56 AM 0.3 mL 04/25/2018 Intramuscular   Manufacturer: [place]   Lot: [id number]   [place]: [id number]

## 2019-06-26 DIAGNOSIS — L814 Other melanin hyperpigmentation: Secondary | ICD-10-CM | POA: Diagnosis not present

## 2019-06-26 DIAGNOSIS — L578 Other skin changes due to chronic exposure to nonionizing radiation: Secondary | ICD-10-CM | POA: Diagnosis not present

## 2019-06-26 DIAGNOSIS — C44319 Basal cell carcinoma of skin of other parts of face: Secondary | ICD-10-CM | POA: Diagnosis not present

## 2019-06-26 DIAGNOSIS — L720 Epidermal cyst: Secondary | ICD-10-CM | POA: Diagnosis not present

## 2019-07-04 DIAGNOSIS — M62830 Muscle spasm of back: Secondary | ICD-10-CM | POA: Diagnosis not present

## 2019-07-04 DIAGNOSIS — M5033 Other cervical disc degeneration, cervicothoracic region: Secondary | ICD-10-CM | POA: Diagnosis not present

## 2019-07-04 DIAGNOSIS — M9901 Segmental and somatic dysfunction of cervical region: Secondary | ICD-10-CM | POA: Diagnosis not present

## 2019-07-04 DIAGNOSIS — M9902 Segmental and somatic dysfunction of thoracic region: Secondary | ICD-10-CM | POA: Diagnosis not present

## 2019-07-17 ENCOUNTER — Ambulatory Visit: Payer: Medicare HMO

## 2019-07-18 ENCOUNTER — Ambulatory Visit: Payer: Medicare HMO

## 2019-07-24 ENCOUNTER — Ambulatory Visit: Payer: Medicare HMO | Attending: Internal Medicine

## 2019-07-24 DIAGNOSIS — Z23 Encounter for immunization: Secondary | ICD-10-CM

## 2019-07-24 NOTE — Progress Notes (Signed)
° °  Covid-19 Vaccination Clinic  Name:  Sariya Randol    MRN: [id number] DOB: 07-10-1945  07/24/2019  Ms. Chisolm was observed post Covid-19 immunization for 15 minutes without incident. She was provided with Vaccine Information Sheet and instruction to access the V-Safe system.   Ms. Roe was instructed to call 911 with any severe reactions post vaccine:  Difficulty breathing   Swelling of face and throat   A fast heartbeat   A bad rash all over body   Dizziness and weakness   Immunizations Administered    Name Date Dose VIS Date Route   Pfizer COVID-19 Vaccine 07/24/2019  9:54 AM 0.3 mL 04/25/2018 Intramuscular   Manufacturer:    Lot: [id number]   [place]: [id number]

## 2019-08-01 DIAGNOSIS — M5033 Other cervical disc degeneration, cervicothoracic region: Secondary | ICD-10-CM | POA: Diagnosis not present

## 2019-08-01 DIAGNOSIS — M9902 Segmental and somatic dysfunction of thoracic region: Secondary | ICD-10-CM | POA: Diagnosis not present

## 2019-08-01 DIAGNOSIS — M9901 Segmental and somatic dysfunction of cervical region: Secondary | ICD-10-CM | POA: Diagnosis not present

## 2019-08-01 DIAGNOSIS — M62830 Muscle spasm of back: Secondary | ICD-10-CM | POA: Diagnosis not present

## 2019-08-23 ENCOUNTER — Encounter: Payer: Self-pay | Admitting: Family Medicine

## 2019-08-23 ENCOUNTER — Other Ambulatory Visit: Payer: Self-pay

## 2019-08-23 ENCOUNTER — Telehealth: Payer: Self-pay | Admitting: Internal Medicine

## 2019-08-23 ENCOUNTER — Ambulatory Visit (INDEPENDENT_AMBULATORY_CARE_PROVIDER_SITE_OTHER): Payer: Medicare HMO | Admitting: Family Medicine

## 2019-08-23 VITALS — BP 118/74 | HR 62 | Temp 98.0°F | Ht 67.0 in | Wt 171.0 lb

## 2019-08-23 DIAGNOSIS — J0100 Acute maxillary sinusitis, unspecified: Secondary | ICD-10-CM | POA: Diagnosis not present

## 2019-08-23 DIAGNOSIS — Z8619 Personal history of other infectious and parasitic diseases: Secondary | ICD-10-CM | POA: Diagnosis not present

## 2019-08-23 MED ORDER — FLUCONAZOLE 150 MG PO TABS: 150.0000 mg | ORAL_TABLET | Freq: Once | ORAL | 0 refills | Status: AC

## 2019-08-23 MED ORDER — AMOXICILLIN-POT CLAVULANATE 875-125 MG PO TABS: 1.0000 | ORAL_TABLET | Freq: Two times a day (BID) | ORAL | 0 refills | Status: DC

## 2019-08-23 NOTE — Progress Notes (Signed)
Date:  08/23/2019   Name:  Dana Cameron   DOB:  08-Sep-1945   MRN:  [id number]   Chief Complaint: Sinusitis (x6 days,mucous is thick and dark yellow, cough, no fever, runny nose, sneezing)  Sinusitis This is a new problem. The current episode started in the past 7 days (Tuesday). The problem has been gradually worsening since onset. There has been no fever. The pain is mild. Associated symptoms include chills, congestion, coughing, headaches, sinus pressure, a sore throat and swollen glands. Pertinent negatives include no diaphoresis, ear pain, hoarse voice, neck pain, shortness of breath or sneezing. (Prod nasal discharge/ thick yellow) Past treatments include oral decongestants (sudafed/steroids). The treatment provided mild relief.    Lab Results  Component Value Date   CREATININE 0.9 07/04/2018   BUN 15 07/04/2018   NA 144 07/04/2018   K 4.2 07/04/2018   CL 104 01/30/2018   CO2 24 01/30/2018   Lab Results  Component Value Date   CHOL 170 01/30/2018   HDL 58 01/30/2018   LDLCALC 84 01/30/2018   TRIG 140 01/30/2018   CHOLHDL 2.9 01/30/2018   Lab Results  Component Value Date   TSH 2.850 01/30/2018   No results found for: HGBA1C Lab Results  Component Value Date   WBC 4.2 07/04/2018   HGB 14.6 07/04/2018   HCT 43 07/04/2018   MCV 93 01/30/2018   PLT 268 07/04/2018   Lab Results  Component Value Date   ALT 12 01/30/2018   AST 15 01/30/2018   ALKPHOS 85 01/30/2018   BILITOT 0.5 01/30/2018     Review of Systems  Constitutional: Positive for chills. Negative for diaphoresis, fatigue, fever and unexpected weight change.  HENT: Positive for congestion, sinus pressure and sore throat. Negative for ear discharge, ear pain, hoarse voice, rhinorrhea and sneezing.   Eyes: Negative for photophobia, pain, discharge, redness and itching.  Respiratory: Positive for cough. Negative for shortness of breath, wheezing and stridor.   Gastrointestinal: Negative for  abdominal pain, blood in stool, constipation, diarrhea, nausea and vomiting.  Endocrine: Negative for cold intolerance, heat intolerance, polydipsia, polyphagia and polyuria.  Genitourinary: Negative for dysuria, flank pain, frequency, hematuria, menstrual problem, pelvic pain, urgency, vaginal bleeding and vaginal discharge.  Musculoskeletal: Negative for arthralgias, back pain, myalgias and neck pain.  Skin: Negative for rash.  Allergic/Immunologic: Negative for environmental allergies and food allergies.  Neurological: Positive for headaches. Negative for dizziness, weakness, light-headedness and numbness.  Hematological: Negative for adenopathy. Does not bruise/bleed easily.  Psychiatric/Behavioral: Negative for dysphoric mood. The patient is not nervous/anxious.     Patient Active Problem List   Diagnosis Date Noted  . Vitamin B 12 deficiency 08/01/2018  . Varicose vein of leg 07/11/2018  . Colon polyp, hyperplastic 01/28/2017  . Arthritis of right hip 01/26/2016  . Hyperlipidemia, mild 03/28/2015  . Fibrocystic breast 09/17/2014  . Environmental and seasonal allergies 09/17/2014  . Pelvic prolapse 09/17/2014  . Hot flash, menopausal 09/17/2014    No Known Allergies  Past Surgical History:  Procedure Laterality Date  . BREAST BIOPSY Left 1993   neg  . COLONOSCOPY  12/31/2003   one hyperplastic polyp  . EXCISION VAGINAL CYST    . TOTAL KNEE ARTHROPLASTY Right 07/26/2017    Social History   Tobacco Use  . Smoking status: Never Smoker  . Smokeless tobacco: Never Used  Vaping Use  . Vaping Use: Never used  Substance Use Topics  . Alcohol use: No    Alcohol/week:  0.0 standard drinks  . Drug use: No     Medication list has been reviewed and updated.  Current Meds  Medication Sig  . Estradiol-Estriol-Progesterone (BIEST/PROGESTERONE) CREA APPLY 1 ML TOPICALLY EVERY DAY OR AS DIRECTED  . fluticasone (FLONASE) 50 MCG/ACT nasal spray USE TWO SPRAY(S) IN EACH NOSTRIL  ONCE DAILY (Patient taking differently: As needed)  . NONFORMULARY OR COMPOUNDED ITEM Progesterone 3.5% HRT - apply 1-2 ml topically daily  . Nutritional Supplements (JUICE PLUS FIBRE PO) Take by mouth.  . Omega 3 1000 MG CAPS Take 1 capsule by mouth daily. Juice plus brand with 5 omegas    PHQ 2/9 Scores 08/23/2019 01/31/2019 01/19/2019 08/01/2018  PHQ - 2 Score 0 0 0 0  PHQ- 9 Score 0 - - -    GAD 7 : Generalized Anxiety Score 08/23/2019  Nervous, Anxious, on Edge 0  Control/stop worrying 0  Worry too much - different things 0  Trouble relaxing 0  Restless 0  Easily annoyed or irritable 0  Afraid - awful might happen 0  Total GAD 7 Score 0  Anxiety Difficulty Not difficult at all    BP Readings from Last 3 Encounters:  08/23/19 118/74  01/19/19 136/82  08/01/18 124/76    Physical Exam Vitals and nursing note reviewed.  Constitutional:      Appearance: She is well-developed.  HENT:     Head: Normocephalic.     Jaw: There is normal jaw occlusion.     Right Ear: Tympanic membrane, ear canal and external ear normal.     Left Ear: Tympanic membrane, ear canal and external ear normal.     Nose:     Right Turbinates: Enlarged.     Left Turbinates: Enlarged.     Right Sinus: Maxillary sinus tenderness present. No frontal sinus tenderness.     Left Sinus: No maxillary sinus tenderness or frontal sinus tenderness.     Mouth/Throat:     Mouth: Mucous membranes are moist.     Pharynx: Oropharynx is clear. Uvula midline. No oropharyngeal exudate or posterior oropharyngeal erythema.  Eyes:     General: Lids are everted, no foreign bodies appreciated. No scleral icterus.       Left eye: No foreign body or hordeolum.     Extraocular Movements: Extraocular movements intact.     Conjunctiva/sclera: Conjunctivae normal.     Right eye: Right conjunctiva is not injected.     Left eye: Left conjunctiva is not injected.     Pupils: Pupils are equal, round, and reactive to light.  Neck:      Thyroid: No thyromegaly.     Vascular: No JVD.     Trachea: No tracheal deviation.  Cardiovascular:     Rate and Rhythm: Normal rate and regular rhythm.     Heart sounds: Normal heart sounds. No murmur heard.  No friction rub. No gallop.   Pulmonary:     Effort: Pulmonary effort is normal. No respiratory distress.     Breath sounds: Normal breath sounds. No stridor. No wheezing, rhonchi or rales.  Chest:     Chest wall: No tenderness.  Abdominal:     General: Bowel sounds are normal.     Palpations: Abdomen is soft. There is no mass.     Tenderness: There is no abdominal tenderness. There is no right CVA tenderness, left CVA tenderness, guarding or rebound.  Musculoskeletal:        General: No tenderness. Normal range of motion.  Cervical back: Normal range of motion and neck supple.  Lymphadenopathy:     Head:     Right side of head: No submental, submandibular or tonsillar adenopathy.     Left side of head: No submental, submandibular or tonsillar adenopathy.     Cervical: No cervical adenopathy.     Right cervical: No superficial, deep or posterior cervical adenopathy.    Left cervical: No superficial, deep or posterior cervical adenopathy.  Skin:    General: Skin is warm.     Findings: No erythema or rash.  Neurological:     Mental Status: She is alert and oriented to person, place, and time.     Cranial Nerves: No cranial nerve deficit.     Motor: No weakness.     Coordination: Coordination normal.     Deep Tendon Reflexes: Reflexes normal.  Psychiatric:        Mood and Affect: Mood is not anxious or depressed.     Wt Readings from Last 3 Encounters:  08/23/19 171 lb (77.6 kg)  01/31/19 170 lb (77.1 kg)  01/19/19 174 lb (78.9 kg)    BP 118/74   Pulse 62   Temp 98 F (36.7 C) (Oral)   Ht 5\' 7"  (1.702 m)   Wt 171 lb (77.6 kg)   SpO2 97%   BMI 26.78 kg/m   Assessment and Plan: 1. Acute maxillary sinusitis, recurrence not specified Acute.  Persistent.   Stable.  History and physical exam is consistent with an acute maxillary sinusitis will treat with Augmentin 875 twice a day for 10 days. - amoxicillin-clavulanate (AUGMENTIN) 875-125 MG tablet; Take 1 tablet by mouth 2 (two) times daily.  Dispense: 20 tablet; Refill: 0  2. History of candidiasis Patient has history of candidiasis of the vulvovaginal area when taking antibiotics and we will give her Diflucan to take prophylactically afterwards. - fluconazole (DIFLUCAN) 150 MG tablet; Take 1 tablet (150 mg total) by mouth once for 1 dose.  Dispense: 1 tablet; Refill: 0

## 2019-08-23 NOTE — Telephone Encounter (Signed)
Please advise  Copied from [place] [id number]. Topic: Appointment Scheduling - Scheduling Inquiry for Clinic >> Aug 23, 2019  9:37 AM Percell Belt A wrote: Reason for CRM: per office send crm to see if Dr Ronnald Ramp can see pt today for possible sinus infection. Pt stated she has drainage, headache , no fever.  She stated she had covid back in dec 2020 and has had her 2 phizers shots.  She stated this has been going on for he past week  Best number -[phone]

## 2019-08-23 NOTE — Telephone Encounter (Signed)
Pt coming this afternoon

## 2019-08-27 ENCOUNTER — Other Ambulatory Visit: Payer: Self-pay | Admitting: Internal Medicine

## 2019-08-27 NOTE — Telephone Encounter (Signed)
Call placed to pt.  Advised that she should have refills at pharmacy on requested medication.  Reported the pharmacy fills the Biest and Progesterone separately, even though it is ordered as a combination medication.    Phone call to pharmacy.  Spoke with pharmacist.  Per pharmacist, they did not receive the new order in April.  Gave v.o. for Biest/Progesterone cream; 30 g. , refills x 3.

## 2019-09-03 DIAGNOSIS — M5033 Other cervical disc degeneration, cervicothoracic region: Secondary | ICD-10-CM | POA: Diagnosis not present

## 2019-09-03 DIAGNOSIS — M9901 Segmental and somatic dysfunction of cervical region: Secondary | ICD-10-CM | POA: Diagnosis not present

## 2019-09-03 DIAGNOSIS — M62830 Muscle spasm of back: Secondary | ICD-10-CM | POA: Diagnosis not present

## 2019-09-03 DIAGNOSIS — M9902 Segmental and somatic dysfunction of thoracic region: Secondary | ICD-10-CM | POA: Diagnosis not present

## 2019-10-08 DIAGNOSIS — M9901 Segmental and somatic dysfunction of cervical region: Secondary | ICD-10-CM | POA: Diagnosis not present

## 2019-10-08 DIAGNOSIS — M62830 Muscle spasm of back: Secondary | ICD-10-CM | POA: Diagnosis not present

## 2019-10-08 DIAGNOSIS — M5033 Other cervical disc degeneration, cervicothoracic region: Secondary | ICD-10-CM | POA: Diagnosis not present

## 2019-10-08 DIAGNOSIS — M9902 Segmental and somatic dysfunction of thoracic region: Secondary | ICD-10-CM | POA: Diagnosis not present

## 2019-11-07 DIAGNOSIS — M5033 Other cervical disc degeneration, cervicothoracic region: Secondary | ICD-10-CM | POA: Diagnosis not present

## 2019-11-07 DIAGNOSIS — M62830 Muscle spasm of back: Secondary | ICD-10-CM | POA: Diagnosis not present

## 2019-11-07 DIAGNOSIS — M9901 Segmental and somatic dysfunction of cervical region: Secondary | ICD-10-CM | POA: Diagnosis not present

## 2019-11-07 DIAGNOSIS — M9902 Segmental and somatic dysfunction of thoracic region: Secondary | ICD-10-CM | POA: Diagnosis not present

## 2019-12-05 ENCOUNTER — Telehealth: Payer: Self-pay

## 2019-12-05 ENCOUNTER — Other Ambulatory Visit: Payer: Self-pay

## 2019-12-05 DIAGNOSIS — M9902 Segmental and somatic dysfunction of thoracic region: Secondary | ICD-10-CM | POA: Diagnosis not present

## 2019-12-05 DIAGNOSIS — M5033 Other cervical disc degeneration, cervicothoracic region: Secondary | ICD-10-CM | POA: Diagnosis not present

## 2019-12-05 DIAGNOSIS — M62830 Muscle spasm of back: Secondary | ICD-10-CM | POA: Diagnosis not present

## 2019-12-05 DIAGNOSIS — M9901 Segmental and somatic dysfunction of cervical region: Secondary | ICD-10-CM | POA: Diagnosis not present

## 2019-12-05 DIAGNOSIS — Z1211 Encounter for screening for malignant neoplasm of colon: Secondary | ICD-10-CM

## 2019-12-05 NOTE — Telephone Encounter (Unsigned)
Copied from [place] [id number]. Topic: General - Other >> Dec 05, 2019  3:29 PM Alanda Slim E wrote: Reason for CRM: Corinne Ports with [facility] called and stated that the pt was ready to schedule a colonoscopy/ please advise Pt

## 2019-12-06 NOTE — Telephone Encounter (Signed)
Spoke to pt, will wait on call from GI

## 2019-12-10 DIAGNOSIS — R69 Illness, unspecified: Secondary | ICD-10-CM | POA: Diagnosis not present

## 2019-12-10 DIAGNOSIS — H2512 Age-related nuclear cataract, left eye: Secondary | ICD-10-CM | POA: Diagnosis not present

## 2019-12-11 DIAGNOSIS — I83893 Varicose veins of bilateral lower extremities with other complications: Secondary | ICD-10-CM | POA: Diagnosis not present

## 2019-12-19 ENCOUNTER — Telehealth (INDEPENDENT_AMBULATORY_CARE_PROVIDER_SITE_OTHER): Payer: Self-pay | Admitting: Gastroenterology

## 2019-12-19 ENCOUNTER — Other Ambulatory Visit: Payer: Self-pay

## 2019-12-19 DIAGNOSIS — Z1211 Encounter for screening for malignant neoplasm of colon: Secondary | ICD-10-CM

## 2019-12-19 MED ORDER — NA SULFATE-K SULFATE-MG SULF 17.5-3.13-1.6 GM/177ML PO SOLN: 1.0000 | Freq: Once | ORAL | 0 refills | Status: AC

## 2019-12-19 NOTE — Progress Notes (Signed)
Gastroenterology Pre-Procedure Review ° °Request Date: Monday 01/17/20 °Requesting Physician: Dr. Wohl ° °PATIENT REVIEW QUESTIONS: The patient responded to the following health history questions as indicated:   ° °1. Are you having any GI issues? no °2. Do you have a personal history of Polyps? no °3. Do you have a family history of Colon Cancer or Polyps? no °4. Diabetes Mellitus? no °5. Joint replacements in the past 12 months?no varicose vein treatment 1 year ago °6. Major health problems in the past 3 months?no °7. Any artificial heart valves, MVP, or defibrillator?no °   °MEDICATIONS & ALLERGIES:    °Patient reports the following regarding taking any anticoagulation/antiplatelet therapy:   °Plavix, Coumadin, Eliquis, Xarelto, Lovenox, Pradaxa, Brilinta, or Effient? no °Aspirin? no ° °Patient confirms/reports the following medications:  °Current Outpatient Medications  °Medication Sig Dispense Refill  °• NONFORMULARY OR COMPOUNDED ITEM Progesterone 3.5% HRT - apply 1-2 ml topically daily 60 each 5  °• amoxicillin-clavulanate (AUGMENTIN) 875-125 MG tablet Take 1 tablet by mouth 2 (two) times daily. (Patient not taking: Reported on 12/19/2019) 20 tablet 0  °• Estradiol-Estriol-Progesterone (BIEST/PROGESTERONE) CREA APPLY 1 ML TOPICALLY EVERY DAY OR AS DIRECTED (Patient not taking: Reported on 12/19/2019) 30 g 3  °• fluticasone (FLONASE) 50 MCG/ACT nasal spray USE TWO SPRAY(S) IN EACH NOSTRIL ONCE DAILY (Patient not taking: Reported on 12/19/2019) 16 g 0  °• Na Sulfate-K Sulfate-Mg Sulf 17.5-3.13-1.6 GM/177ML SOLN Take 1 kit by mouth once for 1 dose. 354 mL 0  °• Nutritional Supplements (JUICE PLUS FIBRE PO) Take by mouth. (Patient not taking: Reported on 12/19/2019)    °• Omega 3 1000 MG CAPS Take 1 capsule by mouth daily. Juice plus brand with 5 omegas (Patient not taking: Reported on 12/19/2019)    ° °No current facility-administered medications for this visit.  ° ° °Patient confirms/reports the following  allergies:  °No Known Allergies ° °No orders of the defined types were placed in this encounter. ° ° °AUTHORIZATION INFORMATION °Primary Insurance: °1D#: °Group #: ° °Secondary Insurance: °1D#: °Group #: ° °SCHEDULE INFORMATION: °Date: Monday 01/07/20 °Time: °Location:[facility] °

## 2019-12-31 DIAGNOSIS — Z85828 Personal history of other malignant neoplasm of skin: Secondary | ICD-10-CM | POA: Diagnosis not present

## 2019-12-31 DIAGNOSIS — I8312 Varicose veins of left lower extremity with inflammation: Secondary | ICD-10-CM | POA: Diagnosis not present

## 2019-12-31 DIAGNOSIS — L719 Rosacea, unspecified: Secondary | ICD-10-CM | POA: Diagnosis not present

## 2019-12-31 DIAGNOSIS — I8311 Varicose veins of right lower extremity with inflammation: Secondary | ICD-10-CM | POA: Diagnosis not present

## 2019-12-31 DIAGNOSIS — L570 Actinic keratosis: Secondary | ICD-10-CM | POA: Diagnosis not present

## 2019-12-31 DIAGNOSIS — L821 Other seborrheic keratosis: Secondary | ICD-10-CM | POA: Diagnosis not present

## 2019-12-31 DIAGNOSIS — D492 Neoplasm of unspecified behavior of bone, soft tissue, and skin: Secondary | ICD-10-CM | POA: Diagnosis not present

## 2020-01-01 ENCOUNTER — Other Ambulatory Visit: Payer: Self-pay

## 2020-01-01 ENCOUNTER — Encounter: Payer: Self-pay | Admitting: Gastroenterology

## 2020-01-02 DIAGNOSIS — M9901 Segmental and somatic dysfunction of cervical region: Secondary | ICD-10-CM | POA: Diagnosis not present

## 2020-01-02 DIAGNOSIS — M62830 Muscle spasm of back: Secondary | ICD-10-CM | POA: Diagnosis not present

## 2020-01-02 DIAGNOSIS — M5033 Other cervical disc degeneration, cervicothoracic region: Secondary | ICD-10-CM | POA: Diagnosis not present

## 2020-01-02 DIAGNOSIS — M9902 Segmental and somatic dysfunction of thoracic region: Secondary | ICD-10-CM | POA: Diagnosis not present

## 2020-01-03 ENCOUNTER — Other Ambulatory Visit: Payer: Self-pay

## 2020-01-03 ENCOUNTER — Other Ambulatory Visit
Admission: RE | Admit: 2020-01-03 | Discharge: 2020-01-03 | Disposition: A | Discharge status: Home / Self Care | Payer: Medicare HMO | Source: Ambulatory Visit | Attending: Gastroenterology | Admitting: Gastroenterology

## 2020-01-03 DIAGNOSIS — Z01812 Encounter for preprocedural laboratory examination: Secondary | ICD-10-CM | POA: Insufficient documentation

## 2020-01-03 DIAGNOSIS — Z20822 Contact with and (suspected) exposure to covid-19: Secondary | ICD-10-CM | POA: Insufficient documentation

## 2020-01-03 LAB — SARS CORONAVIRUS 2 (TAT 6-24 HRS): SARS Coronavirus 2: NEGATIVE

## 2020-01-03 NOTE — Discharge Instructions (Signed)
General Anesthesia, Adult, Care After This sheet gives you information about how to care for yourself after your procedure. Your health care provider may also give you more specific instructions. If you have problems or questions, contact your health care provider. What can I expect after the procedure? After the procedure, the following side effects are common:  Pain or discomfort at the IV site.  Nausea.  Vomiting.  Sore throat.  Trouble concentrating.  Feeling cold or chills.  Weak or tired.  Sleepiness and fatigue.  Soreness and body aches. These side effects can affect parts of the body that were not involved in surgery. Follow these instructions at home:  For at least 24 hours after the procedure:  Have a responsible adult stay with you. It is important to have someone help care for you until you are awake and alert.  Rest as needed.  Do not: ? Participate in activities in which you could fall or become injured. ? Drive. ? Use heavy machinery. ? Drink alcohol. ? Take sleeping pills or medicines that cause drowsiness. ? Make important decisions or sign legal documents. ? Take care of children on your own. Eating and drinking  Follow any instructions from your health care provider about eating or drinking restrictions.  When you feel hungry, start by eating small amounts of foods that are soft and easy to digest (bland), such as toast. Gradually return to your regular diet.  Drink enough fluid to keep your urine pale yellow.  If you vomit, rehydrate by drinking water, juice, or clear broth. General instructions  If you have sleep apnea, surgery and certain medicines can increase your risk for breathing problems. Follow instructions from your health care provider about wearing your sleep device: ? Anytime you are sleeping, including during daytime naps. ? While taking prescription pain medicines, sleeping medicines, or medicines that make you drowsy.  Return to  your normal activities as told by your health care provider. Ask your health care provider what activities are safe for you.  Take over-the-counter and prescription medicines only as told by your health care provider.  If you smoke, do not smoke without supervision.  Keep all follow-up visits as told by your health care provider. This is important. Contact a health care provider if:  You have nausea or vomiting that does not get better with medicine.  You cannot eat or drink without vomiting.  You have pain that does not get better with medicine.  You are unable to pass urine.  You develop a skin rash.  You have a fever.  You have redness around your IV site that gets worse. Get help right away if:  You have difficulty breathing.  You have chest pain.  You have blood in your urine or stool, or you vomit blood. Summary  After the procedure, it is common to have a sore throat or nausea. It is also common to feel tired.  Have a responsible adult stay with you for the first 24 hours after general anesthesia. It is important to have someone help care for you until you are awake and alert.  When you feel hungry, start by eating small amounts of foods that are soft and easy to digest (bland), such as toast. Gradually return to your regular diet.  Drink enough fluid to keep your urine pale yellow.  Return to your normal activities as told by your health care provider. Ask your health care provider what activities are safe for you. This information is not   intended to replace advice given to you by your health care provider. Make sure you discuss any questions you have with your health care provider. Document Revised: 02/18/2017 Document Reviewed: 10/01/2016 Elsevier Patient Education  2020 Elsevier Inc.  

## 2020-01-04 NOTE — Anesthesia Preprocedure Evaluation (Addendum)
Anesthesia Evaluation  Patient identified by MRN, date of birth, ID band Patient awake    Reviewed: Allergy & Precautions, H&P , NPO status , Patient's Chart, lab work & pertinent test results  History of Anesthesia Complications (+) PONV and history of anesthetic complications  Airway Mallampati: II  TM Distance: >3 FB Neck ROM: full    Dental no notable dental hx.    Pulmonary    Pulmonary exam normal breath sounds clear to auscultation       Cardiovascular Normal cardiovascular exam Rhythm:regular Rate:Normal     Neuro/Psych    GI/Hepatic   Endo/Other    Renal/GU      Musculoskeletal   Abdominal   Peds  Hematology   Anesthesia Other Findings   Reproductive/Obstetrics                             Anesthesia Physical Anesthesia Plan  ASA: II  Anesthesia Plan: General   Post-op Pain Management:    Induction: Intravenous  PONV Risk Score and Plan: 4 or greater and Treatment may vary due to age or medical condition, Propofol infusion and TIVA  Airway Management Planned: Natural Airway  Additional Equipment:   Intra-op Plan:   Post-operative Plan:   Informed Consent: I have reviewed the patients History and Physical, chart, labs and discussed the procedure including the risks, benefits and alternatives for the proposed anesthesia with the patient or authorized representative who has indicated his/her understanding and acceptance.     Dental Advisory Given  Plan Discussed with: CRNA  Anesthesia Plan Comments:         Anesthesia Quick Evaluation

## 2020-01-07 ENCOUNTER — Ambulatory Visit: Payer: Medicare HMO | Admitting: Anesthesiology

## 2020-01-07 ENCOUNTER — Encounter
Admission: RE | Disposition: A | Discharge status: Home / Self Care | Payer: Self-pay | Source: Home / Self Care | Attending: Gastroenterology

## 2020-01-07 ENCOUNTER — Encounter: Payer: Self-pay | Admitting: Gastroenterology

## 2020-01-07 ENCOUNTER — Ambulatory Visit
Admission: RE | Admit: 2020-01-07 | Discharge: 2020-01-07 | Disposition: A | Discharge status: Home / Self Care | Payer: Medicare HMO | Attending: Gastroenterology | Admitting: Gastroenterology

## 2020-01-07 DIAGNOSIS — Z8719 Personal history of other diseases of the digestive system: Secondary | ICD-10-CM | POA: Insufficient documentation

## 2020-01-07 DIAGNOSIS — Z1211 Encounter for screening for malignant neoplasm of colon: Secondary | ICD-10-CM | POA: Diagnosis not present

## 2020-01-07 DIAGNOSIS — K5730 Diverticulosis of large intestine without perforation or abscess without bleeding: Secondary | ICD-10-CM | POA: Insufficient documentation

## 2020-01-07 DIAGNOSIS — K640 First degree hemorrhoids: Secondary | ICD-10-CM | POA: Diagnosis not present

## 2020-01-07 HISTORY — DX: Other specified postprocedural states: Z98.890

## 2020-01-07 HISTORY — DX: Other specified postprocedural states: R11.2

## 2020-01-07 HISTORY — PX: COLONOSCOPY WITH PROPOFOL: SHX5780

## 2020-01-07 HISTORY — DX: Asymptomatic varicose veins of bilateral lower extremities: I83.93

## 2020-01-07 SURGERY — COLONOSCOPY WITH PROPOFOL
Anesthesia: General | Site: Rectum

## 2020-01-07 MED ORDER — PROPOFOL 10 MG/ML IV BOLUS
INTRAVENOUS | Status: DC | PRN
  Administered 2020-01-07 (×2): 40 mg via INTRAVENOUS
  Administered 2020-01-07: 150 mg via INTRAVENOUS
  Administered 2020-01-07 (×2): 50 mg via INTRAVENOUS

## 2020-01-07 MED ORDER — LIDOCAINE HCL (CARDIAC) PF 100 MG/5ML IV SOSY
PREFILLED_SYRINGE | INTRAVENOUS | Status: DC | PRN
  Administered 2020-01-07: 50 mg via INTRAVENOUS

## 2020-01-07 MED ORDER — ACETAMINOPHEN 160 MG/5ML PO SOLN: 325.0000 mg | Freq: Once | ORAL | Status: DC

## 2020-01-07 MED ORDER — ACETAMINOPHEN 325 MG PO TABS: 325.0000 mg | ORAL_TABLET | Freq: Once | ORAL | Status: DC

## 2020-01-07 MED ORDER — STERILE WATER FOR IRRIGATION IR SOLN: Status: DC | PRN

## 2020-01-07 MED ORDER — LACTATED RINGERS IV SOLN: INTRAVENOUS | Status: DC

## 2020-01-07 SURGICAL SUPPLY — 6 items
GOWN CVR UNV OPN BCK APRN NK (MISCELLANEOUS) ×2 IMPLANT
GOWN ISOL THUMB LOOP REG UNIV (MISCELLANEOUS) ×4
KIT PRC NS LF DISP ENDO (KITS) ×1 IMPLANT
KIT PROCEDURE OLYMPUS (KITS) ×2
MANIFOLD NEPTUNE II (INSTRUMENTS) ×2 IMPLANT
WATER STERILE IRR 250ML POUR (IV SOLUTION) ×2 IMPLANT

## 2020-01-07 NOTE — Anesthesia Postprocedure Evaluation (Signed)
Anesthesia Post Note  Patient: Dana Cameron  Procedure(s) Performed: COLONOSCOPY WITH PROPOFOL (N/A Rectum)     Patient location during evaluation: PACU Anesthesia Type: General Level of consciousness: awake and alert and oriented Pain management: satisfactory to patient Vital Signs Assessment: post-procedure vital signs reviewed and stable Respiratory status: spontaneous breathing, nonlabored ventilation and respiratory function stable Cardiovascular status: blood pressure returned to baseline and stable Postop Assessment: Adequate PO intake and No signs of nausea or vomiting Anesthetic complications: no   No complications documented.  Raliegh Ip

## 2020-01-07 NOTE — Op Note (Addendum)
[facility] Gastroenterology Patient Name: Dana Cameron Procedure Date: 01/07/2020 7:50 AM MRN: [id number] Account #: [id number] Date of Birth: 1945/09/17 Admit Type: Outpatient Age: 74 Room: [facility] OR ROOM 01 Gender: Female Note Status: Supervisor Override Procedure:             Colonoscopy Indications:           Screening for colorectal malignant neoplasm Providers:             Lucilla Lame MD, MD Referring MD:          Halina Maidens, MD (Referring MD) Medicines:             Propofol per Anesthesia Complications:         No immediate complications. Procedure:             Pre-Anesthesia Assessment:                        - Prior to the procedure, a History and Physical was                         performed, and patient medications and allergies were                         reviewed. The patient's tolerance of previous                         anesthesia was also reviewed. The risks and benefits                         of the procedure and the sedation options and risks                         were discussed with the patient. All questions were                         answered, and informed consent was obtained. Prior                         Anticoagulants: The patient has taken no previous                         anticoagulant or antiplatelet agents. ASA Grade                         Assessment: II - A patient with mild systemic disease.                         After reviewing the risks and benefits, the patient                         was deemed in satisfactory condition to undergo the                         procedure.                        After obtaining informed consent, the colonoscope was  passed under direct vision. Throughout the procedure,                         the patient's blood pressure, pulse, and oxygen                         saturations were monitored continuously. The was                         introduced through the  anus and advanced to the the                         cecum, identified by appendiceal orifice and ileocecal                         valve. The colonoscopy was performed without                         difficulty. The patient tolerated the procedure well.                         The quality of the bowel preparation was excellent. Findings:      The perianal and digital rectal examinations were normal.      Multiple small-mouthed diverticula were found in the sigmoid colon.      Non-bleeding internal hemorrhoids were found during retroflexion. The       hemorrhoids were Grade I (internal hemorrhoids that do not prolapse). Impression:            - Diverticulosis in the sigmoid colon.                        - Non-bleeding internal hemorrhoids.                        - No specimens collected. Recommendation:        - Discharge patient to home.                        - Resume previous diet.                        - Continue present medications.                        - No repeat colonoscopy due to current age (52 years                         or older).                        - unless any change in family history or lower GI                         problems. Procedure Code(s):     --- Professional ---                        [phone], Colonoscopy, flexible; diagnostic, including  collection of specimen(s) by brushing or washing, when                         performed (separate procedure) Diagnosis Code(s):     --- Professional ---                        Z12.11, Encounter for screening for malignant neoplasm                         of colon CPT copyright 2019 American Medical Association. All rights reserved. The codes documented in this report are preliminary and upon coder review may  be revised to meet current compliance requirements. Lucilla Lame MD, MD 01/07/2020 8:19:16 AM This report has been signed electronically. Number of Addenda: 0 Note Initiated On: 01/07/2020  7:50 AM Scope Withdrawal Time: 0 hours 6 minutes 34 seconds  Total Procedure Duration: 0 hours 15 minutes 15 seconds  Estimated Blood Loss:  Estimated blood loss: none.      [facility]

## 2020-01-07 NOTE — H&P (Signed)
Dana Lame, MD [facility] [address] [place], [place] [address] Phone: [phone] Fax : [phone]  Primary Care Physician:  Glean Hess, MD Primary Gastroenterologist:  Dr. Allen Norris  Pre-Procedure History & Physical: HPI:  Dana Cameron is a 74 y.o. female is here for a screening colonoscopy.   Past Medical History:  Diagnosis Date  . Allergy    Fall Seasonal Allergies  . Arthritis of right hip   . Cataract   . Hyperlipidemia   . PONV (postoperative nausea and vomiting)   . Vaginal prolapse   . Varicose veins of legs    has been treated    Past Surgical History:  Procedure Laterality Date  . BREAST BIOPSY Left 1993   neg  . COLONOSCOPY  12/31/2003   one hyperplastic polyp  . EXCISION VAGINAL CYST    . 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 Authorizing Provider  cetirizine (ZYRTEC) 10 MG tablet Take 10 mg by mouth daily.   Yes [provider]  NONFORMULARY OR COMPOUNDED ITEM Progesterone 3.5% HRT - apply 1-2 ml topically daily 09/15/16  Yes Glean Hess, MD  Estradiol-Estriol-Progesterone (BIEST/PROGESTERONE) CREA APPLY 1 ML TOPICALLY EVERY DAY OR AS DIRECTED Patient not taking: Reported on 12/19/2019 08/27/19   Glean Hess, MD  fluticasone [facility]) 50 MCG/ACT nasal spray USE TWO SPRAY(S) IN [facility] NOSTRIL ONCE DAILY Patient not taking: Reported on 12/19/2019 09/17/14   Glean Hess, MD  Nutritional Supplements (JUICE PLUS FIBRE PO) Take by mouth. Patient not taking: Reported on 12/19/2019    [provider]  Omega 3 1000 MG CAPS Take 1 capsule by mouth daily. Juice plus brand with 5 omegas Patient not taking: Reported on 12/19/2019    [provider]    Allergies as of 12/19/2019  . (No Known Allergies)    Family History  Problem Relation Age of Onset  . Transient ischemic attack Mother   . Hypertension Father   .  Stroke Father   . Asthma Sister   . Lung cancer Brother   . Cancer Brother   . Varicose Veins Daughter   . Breast cancer Neg Hx     Social History   Socioeconomic History  . Marital status: Married    Spouse name: Not on file  . Number of children: 2  . Years of education: 65  . Highest education level: High school graduate  Occupational History  . Occupation: retired  Tobacco Use  . Smoking status: Never Smoker  . Smokeless tobacco: Never Used  Vaping Use  . Vaping Use: Never used  Substance and Sexual Activity  . Alcohol use: No    Alcohol/week: 0.0 standard drinks  . Drug use: No  . Sexual activity: Not Currently    Birth control/protection: None  Other Topics Concern  . Not on file  Social History Narrative   Pt lives in [place] January, February and March each year, plays golf there   Social Determinants of Health   Financial Resource Strain:   . Difficulty of Paying Living Expenses: Not on file  Food Insecurity:   . Worried About Charity fundraiser in the Last Year: Not on file  . Ran Out of Food in the Last Year: Not on file  Transportation Needs:   . Lack of Transportation (Medical): Not on file  . Lack of Transportation (Non-Medical): Not on file  Physical Activity:   .  Days of Exercise per Week: Not on file  . Minutes of Exercise per Session: Not on file  Stress:   . Feeling of Stress : Not on file  Social Connections:   . Frequency of Communication with Friends and Family: Not on file  . Frequency of Social Gatherings with Friends and Family: Not on file  . Attends Religious Services: Not on file  . Active Member of Clubs or Organizations: Not on file  . Attends Archivist Meetings: Not on file  . Marital Status: Not on file  Intimate Partner Violence:   . Fear of Current or Ex-Partner: Not on file  . Emotionally Abused: Not on file  . Physically Abused: Not on file  . Sexually Abused: Not on file    Review of Systems: See HPI,  otherwise negative ROS  Physical Exam: Ht 5\' 7"  (1.702 m)   Wt 77.6 kg   BMI 26.78 kg/m  General:   Alert,  pleasant and cooperative in NAD Head:  Normocephalic and atraumatic. Neck:  Supple; no masses or thyromegaly. Lungs:  Clear throughout to auscultation.    Heart:  Regular rate and rhythm. Abdomen:  Soft, nontender and nondistended. Normal bowel sounds, without guarding, and without rebound.   Neurologic:  Alert and  oriented x4;  grossly normal neurologically.  Impression/Plan: Dana Cameron is now here to undergo a screening colonoscopy.  Risks, benefits, and alternatives regarding colonoscopy have been reviewed with the patient.  Questions have been answered.  All parties agreeable.

## 2020-01-07 NOTE — Transfer of Care (Signed)
Immediate Anesthesia Transfer of Care Note  Patient: Dana Cameron  Procedure(s) Performed: COLONOSCOPY WITH PROPOFOL (N/A Rectum)  Patient Location: PACU  Anesthesia Type: General  Level of Consciousness: awake, alert  and patient cooperative  Airway and Oxygen Therapy: Patient Spontanous Breathing and Patient connected to supplemental oxygen  Post-op Assessment: Post-op Vital signs reviewed, Patient's Cardiovascular Status Stable, Respiratory Function Stable, Patent Airway and No signs of Nausea or vomiting  Post-op Vital Signs: Reviewed and stable  Complications: No complications documented.

## 2020-01-08 ENCOUNTER — Encounter: Payer: Self-pay | Admitting: Gastroenterology

## 2020-01-30 DIAGNOSIS — M62830 Muscle spasm of back: Secondary | ICD-10-CM | POA: Diagnosis not present

## 2020-01-30 DIAGNOSIS — M9901 Segmental and somatic dysfunction of cervical region: Secondary | ICD-10-CM | POA: Diagnosis not present

## 2020-01-30 DIAGNOSIS — M9902 Segmental and somatic dysfunction of thoracic region: Secondary | ICD-10-CM | POA: Diagnosis not present

## 2020-01-30 DIAGNOSIS — M5033 Other cervical disc degeneration, cervicothoracic region: Secondary | ICD-10-CM | POA: Diagnosis not present

## 2020-02-01 DIAGNOSIS — H2512 Age-related nuclear cataract, left eye: Secondary | ICD-10-CM | POA: Diagnosis not present

## 2020-02-04 ENCOUNTER — Ambulatory Visit (INDEPENDENT_AMBULATORY_CARE_PROVIDER_SITE_OTHER): Payer: Medicare HMO

## 2020-02-04 ENCOUNTER — Other Ambulatory Visit: Payer: Self-pay

## 2020-02-04 VITALS — BP 122/78 | HR 69 | Temp 98.0°F | Resp 16 | Ht 67.0 in | Wt 171.4 lb

## 2020-02-04 DIAGNOSIS — Z1231 Encounter for screening mammogram for malignant neoplasm of breast: Secondary | ICD-10-CM

## 2020-02-04 DIAGNOSIS — Z0000 Encounter for general adult medical examination without abnormal findings: Secondary | ICD-10-CM

## 2020-02-04 NOTE — Patient Instructions (Signed)
Dana Cameron , Thank you for taking time to come for your Medicare Wellness Visit. I appreciate your ongoing commitment to your health goals. Please review the following plan we discussed and let me know if I can assist you in the future.   Screening recommendations/referrals: Colonoscopy: done 01/07/20 Mammogram: done 02/15/19. Please call [phone] to schedule your mammogram.  Bone Density: done 02/20/19 Recommended yearly ophthalmology/optometry visit for glaucoma screening and checkup Recommended yearly dental visit for hygiene and checkup  Vaccinations: Influenza vaccine: done 12/10/19 Pneumococcal vaccine: done 01/21/15 Tdap vaccine: due Shingles vaccine: Shingrix discussed. Please contact your pharmacy for coverage information.    Covid-19: done 06/21/19 & 07/24/19  Advanced directives: Please bring a copy of your health care power of attorney and living will to the office at your convenience.  Conditions/risks identified: Recommend increasing physical activity to at least 3 days per week  Next appointment: Follow up in one year for your annual wellness visit    Preventive Care 65 Years and Older, Female Preventive care refers to lifestyle choices and visits with your health care provider that can promote health and wellness. What does preventive care include?  A yearly physical exam. This is also called an annual well check.  Dental exams once or twice a year.  Routine eye exams. Ask your health care provider how often you should have your eyes checked.  Personal lifestyle choices, including:  Daily care of your teeth and gums.  Regular physical activity.  Eating a healthy diet.  Avoiding tobacco and drug use.  Limiting alcohol use.  Practicing safe sex.  Taking low-dose aspirin every day.  Taking vitamin and mineral supplements as recommended by your health care provider. What happens during an annual well check? The services and screenings done by your  health care provider during your annual well check will depend on your age, overall health, lifestyle risk factors, and family history of disease. Counseling  Your health care provider may ask you questions about your:  Alcohol use.  Tobacco use.  Drug use.  Emotional well-being.  Home and relationship well-being.  Sexual activity.  Eating habits.  History of falls.  Memory and ability to understand (cognition).  Work and work Statistician.  Reproductive health. Screening  You may have the following tests or measurements:  Height, weight, and BMI.  Blood pressure.  Lipid and cholesterol levels. These may be checked every 5 years, or more frequently if you are over 63 years old.  Skin check.  Lung cancer screening. You may have this screening every year starting at age 54 if you have a 30-pack-year history of smoking and currently smoke or have quit within the past 15 years.  Fecal occult blood test (FOBT) of the stool. You may have this test every year starting at age 62.  Flexible sigmoidoscopy or colonoscopy. You may have a sigmoidoscopy every 5 years or a colonoscopy every 10 years starting at age 52.  Hepatitis C blood test.  Hepatitis B blood test.  Sexually transmitted disease (STD) testing.  Diabetes screening. This is done by checking your blood sugar (glucose) after you have not eaten for a while (fasting). You may have this done every 1-3 years.  Bone density scan. This is done to screen for osteoporosis. You may have this done starting at age 93.  Mammogram. This may be done every 1-2 years. Talk to your health care provider about how often you should have regular mammograms. Talk with your health care provider about your test  results, treatment options, and if necessary, the need for more tests. Vaccines  Your health care provider may recommend certain vaccines, such as:  Influenza vaccine. This is recommended every year.  Tetanus, diphtheria, and  acellular pertussis (Tdap, Td) vaccine. You may need a Td booster every 10 years.  Zoster vaccine. You may need this after age 63.  Pneumococcal 13-valent conjugate (PCV13) vaccine. One dose is recommended after age 41.  Pneumococcal polysaccharide (PPSV23) vaccine. One dose is recommended after age 56. Talk to your health care provider about which screenings and vaccines you need and how often you need them. This information is not intended to replace advice given to you by your health care provider. Make sure you discuss any questions you have with your health care provider. Document Released: 03/14/2015 Document Revised: 11/05/2015 Document Reviewed: 12/17/2014 Elsevier Interactive Patient Education  2017 [employer] Prevention in the Home Falls can cause injuries. They can happen to people of all ages. There are many things you can do to make your home safe and to help prevent falls. What can I do on the outside of my home?  Regularly fix the edges of walkways and driveways and fix any cracks.  Remove anything that might make you trip as you walk through a door, such as a raised step or threshold.  Trim any bushes or trees on the path to your home.  Use bright outdoor lighting.  Clear any walking paths of anything that might make someone trip, such as rocks or tools.  Regularly check to see if handrails are loose or broken. Make sure that both sides of any steps have handrails.  Any raised decks and porches should have guardrails on the edges.  Have any leaves, snow, or ice cleared regularly.  Use sand or salt on walking paths during winter.  Clean up any spills in your garage right away. This includes oil or grease spills. What can I do in the bathroom?  Use night lights.  Install grab bars by the toilet and in the tub and shower. Do not use towel bars as grab bars.  Use non-skid mats or decals in the tub or shower.  If you need to sit down in the shower, use  a plastic, non-slip stool.  Keep the floor dry. Clean up any water that spills on the floor as soon as it happens.  Remove soap buildup in the tub or shower regularly.  Attach bath mats securely with double-sided non-slip rug tape.  Do not have throw rugs and other things on the floor that can make you trip. What can I do in the bedroom?  Use night lights.  Make sure that you have a light by your bed that is easy to reach.  Do not use any sheets or blankets that are too big for your bed. They should not hang down onto the floor.  Have a firm chair that has side arms. You can use this for support while you get dressed.  Do not have throw rugs and other things on the floor that can make you trip. What can I do in the kitchen?  Clean up any spills right away.  Avoid walking on wet floors.  Keep items that you use a lot in easy-to-reach places.  If you need to reach something above you, use a strong step stool that has a grab bar.  Keep electrical cords out of the way.  Do not use floor polish or wax that makes  floors slippery. If you must use wax, use non-skid floor wax.  Do not have throw rugs and other things on the floor that can make you trip. What can I do with my stairs?  Do not leave any items on the stairs.  Make sure that there are handrails on both sides of the stairs and use them. Fix handrails that are broken or loose. Make sure that handrails are as long as the stairways.  Check any carpeting to make sure that it is firmly attached to the stairs. Fix any carpet that is loose or worn.  Avoid having throw rugs at the top or bottom of the stairs. If you do have throw rugs, attach them to the floor with carpet tape.  Make sure that you have a light switch at the top of the stairs and the bottom of the stairs. If you do not have them, ask someone to add them for you. What else can I do to help prevent falls?  Wear shoes that:  Do not have high heels.  Have  rubber bottoms.  Are comfortable and fit you well.  Are closed at the toe. Do not wear sandals.  If you use a stepladder:  Make sure that it is fully opened. Do not climb a closed stepladder.  Make sure that both sides of the stepladder are locked into place.  Ask someone to hold it for you, if possible.  Clearly mark and make sure that you can see:  Any grab bars or handrails.  First and last steps.  Where the edge of each step is.  Use tools that help you move around (mobility aids) if they are needed. These include:  Canes.  Walkers.  Scooters.  Crutches.  Turn on the lights when you go into a dark area. Replace any light bulbs as soon as they burn out.  Set up your furniture so you have a clear path. Avoid moving your furniture around.  If any of your floors are uneven, fix them.  If there are any pets around you, be aware of where they are.  Review your medicines with your doctor. Some medicines can make you feel dizzy. This can increase your chance of falling. Ask your doctor what other things that you can do to help prevent falls. This information is not intended to replace advice given to you by your health care provider. Make sure you discuss any questions you have with your health care provider. Document Released: 12/12/2008 Document Revised: 07/24/2015 Document Reviewed: 03/22/2014 Elsevier Interactive Patient Education  2017 [employer].

## 2020-02-04 NOTE — Progress Notes (Addendum)
Subjective:   Dana Cameron is a 74 y.o. female who presents for Medicare Annual (Subsequent) preventive examination.  Review of Systems     Cardiac Risk Factors include: advanced age (>36men, >31 women)     Objective:    Today's Vitals   02/04/20 0855  BP: 122/78  Pulse: 69  Resp: 16  Temp: 98 F (36.7 C)  TempSrc: Oral  SpO2: 96%  Weight: 171 lb 6.4 oz (77.7 kg)  Height: 5\' 7"  (1.702 m)   Body mass index is 26.85 kg/m.  Advanced Directives 01/07/2020 01/31/2019 01/11/2018 12/30/2016 01/26/2016  Does Patient Have a Medical Advance Directive? Yes Yes Yes Yes Yes  Type of Paramedic of [place];Living will [place];Living will [place];Living will [place];Living will Living will  Does patient want to make changes to medical advance directive? No - Patient declined - - - -  Copy of [place] in Chart? No - copy requested No - copy requested No - copy requested No - copy requested -    Current Medications (verified) Outpatient Encounter Medications as of 02/04/2020  Medication Sig  . cetirizine (ZYRTEC) 10 MG tablet Take 10 mg by mouth daily.  . Estradiol-Estriol-Progesterone (BIEST/PROGESTERONE) CREA APPLY 1 ML TOPICALLY EVERY DAY OR AS DIRECTED  . Nutritional Supplements (JUICE PLUS FIBRE PO) Take by mouth.   . Omega 3 1000 MG CAPS Take 1 capsule by mouth daily. Juice plus brand with 5 omegas  . [DISCONTINUED] fluticasone (FLONASE) 50 MCG/ACT nasal spray USE TWO SPRAY(S) IN EACH NOSTRIL ONCE DAILY (Patient not taking: Reported on 12/19/2019)  . [DISCONTINUED] NONFORMULARY OR COMPOUNDED ITEM Progesterone 3.5% HRT - apply 1-2 ml topically daily   No facility-administered encounter medications on file as of 02/04/2020.    Allergies (verified) Other and Tape   History: Past Medical History:  Diagnosis Date  . Allergy    Fall Seasonal Allergies  . Arthritis of  right hip   . Cataract   . Hyperlipidemia   . PONV (postoperative nausea and vomiting)   . Vaginal prolapse   . Varicose veins of legs    has been treated   Past Surgical History:  Procedure Laterality Date  . BREAST BIOPSY Left 1993   neg  . COLONOSCOPY  12/31/2003   one hyperplastic polyp  . COLONOSCOPY WITH PROPOFOL N/A 01/07/2020   Procedure: COLONOSCOPY WITH PROPOFOL;  Surgeon: Lucilla Lame, MD;  Location: [facility];  Service: Endoscopy;  Laterality: N/A;  priority 4  . EXCISION VAGINAL CYST    . TOTAL KNEE ARTHROPLASTY Right 07/26/2017   Family History  Problem Relation Age of Onset  . Transient ischemic attack Mother   . Stroke Mother   . Hypertension Father   . Stroke Father   . Asthma Sister   . Lung cancer Brother   . Cancer Brother   . Varicose Veins Daughter   . Breast cancer Neg Hx    Social History   Socioeconomic History  . Marital status: Married    Spouse name: Not on file  . Number of children: 2  . Years of education: 60  . Highest education level: High school graduate  Occupational History  . Occupation: retired  Tobacco Use  . Smoking status: Never Smoker  . Smokeless tobacco: Never Used  Vaping Use  . Vaping Use: Never used  Substance and Sexual Activity  . Alcohol use: No    Alcohol/week: 0.0 standard  drinks  . Drug use: No  . Sexual activity: Not Currently    Birth control/protection: None  Other Topics Concern  . Not on file  Social History Narrative   Pt lives in [place] January, February and March each year, plays golf there   Social Determinants of Health   Financial Resource Strain: Low Risk   . Difficulty of Paying Living Expenses: Not hard at all  Food Insecurity: No Food Insecurity  . Worried About Charity fundraiser in the Last Year: Never true  . Ran Out of Food in the Last Year: Never true  Transportation Needs: No Transportation Needs  . Lack of Transportation (Medical): No  . Lack of Transportation  (Non-Medical): No  Physical Activity: Inactive  . Days of Exercise per Week: 0 days  . Minutes of Exercise per Session: 0 min  Stress: No Stress Concern Present  . Feeling of Stress : Not at all  Social Connections: Moderately Integrated  . Frequency of Communication with Friends and Family: More than three times a week  . Frequency of Social Gatherings with Friends and Family: Three times a week  . Attends Religious Services: More than 4 times per year  . Active Member of Clubs or Organizations: No  . Attends Archivist Meetings: Never  . Marital Status: Married    Tobacco Counseling Counseling given: Not Answered   Clinical Intake:  Pre-visit preparation completed: Yes  Pain : No/denies pain     Nutritional Risks: None Diabetes: No  How often do you need to have someone help you when you read instructions, pamphlets, or other written materials from your doctor or pharmacy?: 1 - Never    Interpreter Needed?: No  Information entered by :: Clemetine Marker LPN   Activities of Daily Living In your present state of health, do you have any difficulty performing the following activities: 02/04/2020 01/07/2020  Hearing? Y N  Comment declines hearing aids -  Vision? N N  Difficulty concentrating or making decisions? N N  Walking or climbing stairs? N N  Dressing or bathing? N N  Doing errands, shopping? N -  Preparing Food and eating ? N -  Using the Toilet? N -  In the past six months, have you accidently leaked urine? N -  Do you have problems with loss of bowel control? N -  Managing your Medications? N -  Managing your Finances? N -  Housekeeping or managing your Housekeeping? N -  Some recent data might be hidden    Patient Care Team: Glean Hess, MD as PCP - General (Internal Medicine) Kirk Ruths, MD (Gastroenterology) Dr. Tyler Deis (Vascular Surgery) Jannet Mantis, MD (Dermatology)  Indicate any recent Medical Services you may  have received from other than [facility] providers in the past year (date may be approximate).     Assessment:   This is a routine wellness examination for [place].  Hearing/Vision screen  Hearing Screening   125Hz  250Hz  500Hz  1000Hz  2000Hz  3000Hz  4000Hz  6000Hz  8000Hz   Right ear:           Left ear:           Comments: Pt sees Dr. Charolett Bumpers for hearing evaluations; does not require hearing aids   Vision Screening Comments: Annual vision screenings at [facility] Dr. George Ina  Dietary issues and exercise activities discussed: Current Exercise Habits: The patient does not participate in regular exercise at present, Exercise limited by: None identified  Goals    .  Exercise 150 minutes per week (moderate activity) (pt-stated)      Pt states she would like to walk or do some strengthening exercise, 150 minutes per week.      Depression Screen PHQ 2/9 Scores 02/04/2020 08/23/2019 01/31/2019 01/19/2019 08/01/2018 01/11/2018 12/30/2016  PHQ - 2 Score 0 0 0 0 0 0 0  PHQ- 9 Score - 0 - - - - -    Fall Risk Fall Risk  02/04/2020 08/23/2019 01/31/2019 08/01/2018 01/11/2018  Falls in the past year? 0 0 0 0 0  Number falls in past yr: 0 - 0 0 0  Injury with Fall? 0 - 0 0 -  Risk for fall due to : No Fall Risks No Fall Risks - - -  Follow up Falls prevention discussed Falls evaluation completed Falls prevention discussed Falls evaluation completed -    FALL RISK PREVENTION PERTAINING TO THE HOME:  Any stairs in or around the home? Yes  If so, are there any without handrails? No  Home free of loose throw rugs in walkways, pet beds, electrical cords, etc? Yes  Adequate lighting in your home to reduce risk of falls? Yes   ASSISTIVE DEVICES UTILIZED TO PREVENT FALLS:  Life alert? No  Use of a cane, walker or w/c? No  Grab bars in the bathroom? No  Shower chair or bench in shower? No  Elevated toilet seat or a handicapped toilet? No   TIMED UP AND GO:  Was the test performed? Yes .  Length  of time to ambulate 10 feet: 5 sec.   Gait steady and fast without use of assistive device  Cognitive Function: Normal cognitive status assessed by direct observation by this Nurse Health Advisor. No abnormalities found.       6CIT Screen 01/31/2019 01/11/2018 12/30/2016 01/26/2016  What Year? 0 points 0 points 0 points 0 points  What month? 0 points 0 points 0 points 0 points  What time? 0 points 0 points 0 points 0 points  Count back from 20 0 points 0 points 0 points 0 points  Months in reverse 0 points 0 points 0 points 0 points  Repeat phrase 0 points 0 points 0 points 0 points  Total Score 0 0 0 0    Immunizations Immunization History  Administered Date(s) Administered  . Fluad Quad(high Dose 65+) 12/26/2018  . Influenza, High Dose Seasonal PF 12/30/2016, 02/07/2018  . Influenza, Seasonal, Injecte, Preservative Fre 12/15/2011  . Influenza,inj,Quad PF,6+ Mos 12/15/2012, 12/18/2013, 11/21/2014, 01/26/2016  . Influenza-Unspecified 02/07/2018, 12/10/2019  . PFIZER SARS-COV-2 Vaccination 06/21/2019, 07/24/2019  . Pneumococcal Conjugate-13 01/21/2015  . Pneumococcal Polysaccharide-23 08/24/2012  . Tdap 08/25/2009  . Zoster 06/22/2010    TDAP status: Due, Education has been provided regarding the importance of this vaccine. Advised may receive this vaccine at local pharmacy or Health Dept. Aware to provide a copy of the vaccination record if obtained from local pharmacy or Health Dept. Verbalized acceptance and understanding.   Flu Vaccine status: Up to date  Pneumococcal vaccine status: Up to date  Covid-19 vaccine status: Completed vaccines  Qualifies for Shingles Vaccine? Yes   Zostavax completed Yes   Shingrix Completed?: No.    Education has been provided regarding the importance of this vaccine. Patient has been advised to call insurance company to determine out of pocket expense if they have not yet received this vaccine. Advised may also receive vaccine at local  pharmacy or Health Dept. Verbalized acceptance and understanding.  Screening Tests Health Maintenance  Topic Date Due  . TETANUS/TDAP  03/01/2021 (Originally 08/26/2019)  . MAMMOGRAM  02/15/2020  . COLONOSCOPY  01/06/2030  . INFLUENZA VACCINE  Completed  . DEXA SCAN  Completed  . COVID-19 Vaccine  Completed  . PNA vac Low Risk Adult  Completed  . Hepatitis C Screening  Addressed    Health Maintenance  There are no preventive care reminders to display for this patient.  Colorectal cancer screening: Type of screening: Colonoscopy. Completed 01/07/20. Repeat every 10 years  Mammogram status: Completed 02/15/19. Repeat every year  Bone Density status: Completed 02/20/19. Results reflect: Bone density results: OSTEOPENIA. Repeat every 2 years.  Lung Cancer Screening: (Low Dose CT Chest recommended if Age 46-80 years, 30 pack-year currently smoking OR have quit w/in 15years.) does not qualify.    Additional Screening:  Hepatitis C Screening: does not qualify; Completed 11/29/14  Vision Screening: Recommended annual ophthalmology exams for early detection of glaucoma and other disorders of the eye. Is the patient up to date with their annual eye exam?  Yes  Who is the provider or what is the name of the office in which the patient attends annual eye exams? [place] Screening: Recommended annual dental exams for proper oral hygiene  Community Resource Referral / Chronic Care Management: CRR required this visit?  No   CCM required this visit?  No      Plan:     I have personally reviewed and noted the following in the patient's chart:   . Medical and social history . Use of alcohol, tobacco or illicit drugs  . Current medications and supplements . Functional ability and status . Nutritional status . Physical activity . Advanced directives . List of other physicians . Hospitalizations, surgeries, and ER visits in previous 12  months . Vitals . Screenings to include cognitive, depression, and falls . Referrals and appointments  In addition, I have reviewed and discussed with patient certain preventive protocols, quality metrics, and best practice recommendations. A written personalized care plan for preventive services as well as general preventive health recommendations were provided to patient.     Clemetine Marker, LPN   [id number]   Nurse Notes: pt advised due for labs and CPE with Dr. Army Melia.

## 2020-02-26 DIAGNOSIS — M62830 Muscle spasm of back: Secondary | ICD-10-CM | POA: Diagnosis not present

## 2020-02-26 DIAGNOSIS — M5033 Other cervical disc degeneration, cervicothoracic region: Secondary | ICD-10-CM | POA: Diagnosis not present

## 2020-02-26 DIAGNOSIS — M9902 Segmental and somatic dysfunction of thoracic region: Secondary | ICD-10-CM | POA: Diagnosis not present

## 2020-02-26 DIAGNOSIS — M9901 Segmental and somatic dysfunction of cervical region: Secondary | ICD-10-CM | POA: Diagnosis not present

## 2020-06-11 DIAGNOSIS — M5033 Other cervical disc degeneration, cervicothoracic region: Secondary | ICD-10-CM | POA: Diagnosis not present

## 2020-06-11 DIAGNOSIS — M9901 Segmental and somatic dysfunction of cervical region: Secondary | ICD-10-CM | POA: Diagnosis not present

## 2020-06-11 DIAGNOSIS — M62830 Muscle spasm of back: Secondary | ICD-10-CM | POA: Diagnosis not present

## 2020-06-11 DIAGNOSIS — M9902 Segmental and somatic dysfunction of thoracic region: Secondary | ICD-10-CM | POA: Diagnosis not present

## 2020-06-12 ENCOUNTER — Ambulatory Visit
Admission: RE | Admit: 2020-06-12 | Discharge: 2020-06-12 | Disposition: A | Discharge status: Home / Self Care | Payer: Medicare HMO | Source: Ambulatory Visit | Attending: Internal Medicine | Admitting: Internal Medicine

## 2020-06-12 ENCOUNTER — Other Ambulatory Visit: Payer: Self-pay

## 2020-06-12 DIAGNOSIS — Z1231 Encounter for screening mammogram for malignant neoplasm of breast: Secondary | ICD-10-CM | POA: Diagnosis not present

## 2020-06-25 DIAGNOSIS — M5033 Other cervical disc degeneration, cervicothoracic region: Secondary | ICD-10-CM | POA: Diagnosis not present

## 2020-06-25 DIAGNOSIS — M62830 Muscle spasm of back: Secondary | ICD-10-CM | POA: Diagnosis not present

## 2020-06-25 DIAGNOSIS — M9901 Segmental and somatic dysfunction of cervical region: Secondary | ICD-10-CM | POA: Diagnosis not present

## 2020-06-25 DIAGNOSIS — M9902 Segmental and somatic dysfunction of thoracic region: Secondary | ICD-10-CM | POA: Diagnosis not present

## 2020-06-30 DIAGNOSIS — D229 Melanocytic nevi, unspecified: Secondary | ICD-10-CM | POA: Diagnosis not present

## 2020-06-30 DIAGNOSIS — L820 Inflamed seborrheic keratosis: Secondary | ICD-10-CM | POA: Diagnosis not present

## 2020-06-30 DIAGNOSIS — L814 Other melanin hyperpigmentation: Secondary | ICD-10-CM | POA: Diagnosis not present

## 2020-06-30 DIAGNOSIS — L821 Other seborrheic keratosis: Secondary | ICD-10-CM | POA: Diagnosis not present

## 2020-06-30 DIAGNOSIS — Z85828 Personal history of other malignant neoplasm of skin: Secondary | ICD-10-CM | POA: Diagnosis not present

## 2020-06-30 DIAGNOSIS — D2339 Other benign neoplasm of skin of other parts of face: Secondary | ICD-10-CM | POA: Diagnosis not present

## 2020-06-30 DIAGNOSIS — D492 Neoplasm of unspecified behavior of bone, soft tissue, and skin: Secondary | ICD-10-CM | POA: Diagnosis not present

## 2020-07-03 DIAGNOSIS — H35372 Puckering of macula, left eye: Secondary | ICD-10-CM | POA: Diagnosis not present

## 2020-07-03 DIAGNOSIS — H2513 Age-related nuclear cataract, bilateral: Secondary | ICD-10-CM | POA: Diagnosis not present

## 2020-07-23 DIAGNOSIS — M9902 Segmental and somatic dysfunction of thoracic region: Secondary | ICD-10-CM | POA: Diagnosis not present

## 2020-07-23 DIAGNOSIS — M9901 Segmental and somatic dysfunction of cervical region: Secondary | ICD-10-CM | POA: Diagnosis not present

## 2020-07-23 DIAGNOSIS — H2512 Age-related nuclear cataract, left eye: Secondary | ICD-10-CM | POA: Diagnosis not present

## 2020-07-23 DIAGNOSIS — M5033 Other cervical disc degeneration, cervicothoracic region: Secondary | ICD-10-CM | POA: Diagnosis not present

## 2020-07-23 DIAGNOSIS — M62830 Muscle spasm of back: Secondary | ICD-10-CM | POA: Diagnosis not present

## 2020-07-31 ENCOUNTER — Encounter: Payer: Self-pay | Admitting: Ophthalmology

## 2020-08-12 ENCOUNTER — Encounter: Payer: Self-pay | Admitting: Ophthalmology

## 2020-08-12 ENCOUNTER — Ambulatory Visit
Admission: RE | Admit: 2020-08-12 | Discharge: 2020-08-12 | Disposition: A | Discharge status: Home / Self Care | Payer: Medicare HMO | Attending: Ophthalmology | Admitting: Ophthalmology

## 2020-08-12 ENCOUNTER — Encounter
Admission: RE | Disposition: A | Discharge status: Home / Self Care | Payer: Self-pay | Source: Home / Self Care | Attending: Ophthalmology

## 2020-08-12 ENCOUNTER — Other Ambulatory Visit: Payer: Self-pay

## 2020-08-12 ENCOUNTER — Ambulatory Visit: Payer: Medicare HMO | Admitting: Anesthesiology

## 2020-08-12 DIAGNOSIS — Z8249 Family history of ischemic heart disease and other diseases of the circulatory system: Secondary | ICD-10-CM | POA: Diagnosis not present

## 2020-08-12 DIAGNOSIS — Z96651 Presence of right artificial knee joint: Secondary | ICD-10-CM | POA: Insufficient documentation

## 2020-08-12 DIAGNOSIS — H2512 Age-related nuclear cataract, left eye: Secondary | ICD-10-CM | POA: Diagnosis not present

## 2020-08-12 DIAGNOSIS — Z8719 Personal history of other diseases of the digestive system: Secondary | ICD-10-CM | POA: Insufficient documentation

## 2020-08-12 DIAGNOSIS — E785 Hyperlipidemia, unspecified: Secondary | ICD-10-CM | POA: Insufficient documentation

## 2020-08-12 DIAGNOSIS — H25812 Combined forms of age-related cataract, left eye: Secondary | ICD-10-CM | POA: Diagnosis not present

## 2020-08-12 HISTORY — PX: CATARACT EXTRACTION W/PHACO: SHX586

## 2020-08-12 SURGERY — PHACOEMULSIFICATION, CATARACT, WITH IOL INSERTION
Anesthesia: Monitor Anesthesia Care | Site: Eye | Laterality: Left

## 2020-08-12 MED ORDER — ARMC OPHTHALMIC DILATING DROPS
1.0000 "application " | OPHTHALMIC | Status: DC | PRN
  Administered 2020-08-12 (×3): 1 via OPHTHALMIC

## 2020-08-12 MED ORDER — NA CHONDROIT SULF-NA HYALURON 40-17 MG/ML IO SOLN
INTRAOCULAR | Status: DC | PRN
  Administered 2020-08-12: 1 mL via INTRAOCULAR

## 2020-08-12 MED ORDER — BRIMONIDINE TARTRATE-TIMOLOL 0.2-0.5 % OP SOLN
OPHTHALMIC | Status: DC | PRN
  Administered 2020-08-12: 1 [drp] via OPHTHALMIC

## 2020-08-12 MED ORDER — LACTATED RINGERS IV SOLN: INTRAVENOUS | Status: DC

## 2020-08-12 MED ORDER — EPINEPHRINE PF 1 MG/ML IJ SOLN
INTRAOCULAR | Status: DC | PRN
  Administered 2020-08-12: 52 mL via OPHTHALMIC

## 2020-08-12 MED ORDER — MOXIFLOXACIN HCL 0.5 % OP SOLN
OPHTHALMIC | Status: DC | PRN
  Administered 2020-08-12: 0.2 mL via OPHTHALMIC

## 2020-08-12 MED ORDER — TETRACAINE HCL 0.5 % OP SOLN
1.0000 [drp] | OPHTHALMIC | Status: DC | PRN
  Administered 2020-08-12 (×3): 1 [drp] via OPHTHALMIC

## 2020-08-12 MED ORDER — LIDOCAINE HCL (PF) 2 % IJ SOLN
INTRAOCULAR | Status: DC | PRN
  Administered 2020-08-12: 1 mL via INTRAMUSCULAR

## 2020-08-12 SURGICAL SUPPLY — 19 items
CANNULA ANT/CHMB 27G (MISCELLANEOUS) ×2 IMPLANT
CANNULA ANT/CHMB 27GA (MISCELLANEOUS) ×4 IMPLANT
GLOVE SURG TRIUMPH 8.0 PF LTX (GLOVE) ×2 IMPLANT
GOWN STRL REUS W/ TWL LRG LVL3 (GOWN DISPOSABLE) ×2 IMPLANT
GOWN STRL REUS W/TWL LRG LVL3 (GOWN DISPOSABLE) ×4
LENS IOL TECNIS EYHANCE 22.0 (Intraocular Lens) ×1 IMPLANT
MARKER SKIN DUAL TIP RULER LAB (MISCELLANEOUS) ×2 IMPLANT
NDL FILTER BLUNT 18X1 1/2 (NEEDLE) ×1 IMPLANT
NEEDLE FILTER BLUNT 18X 1/2SAF (NEEDLE) ×1
NEEDLE FILTER BLUNT 18X1 1/2 (NEEDLE) ×1 IMPLANT
PACK EYE AFTER SURG (MISCELLANEOUS) ×2 IMPLANT
PACK OPTHALMIC (MISCELLANEOUS) ×2 IMPLANT
PACK PORFILIO (MISCELLANEOUS) ×2 IMPLANT
SUT ETHILON 10-0 CS-B-6CS-B-6 (SUTURE)
SUTURE EHLN 10-0 CS-B-6CS-B-6 (SUTURE) IMPLANT
SYR 3ML LL SCALE MARK (SYRINGE) ×2 IMPLANT
SYR TB 1ML LUER SLIP (SYRINGE) ×2 IMPLANT
WATER STERILE IRR 250ML POUR (IV SOLUTION) ×2 IMPLANT
WIPE NON LINTING 3.25X3.25 (MISCELLANEOUS) ×2 IMPLANT

## 2020-08-12 NOTE — Transfer of Care (Signed)
Immediate Anesthesia Transfer of Care Note  Patient: Dana Cameron  Procedure(s) Performed: CATARACT EXTRACTION PHACO AND INTRAOCULAR LENS PLACEMENT (IOC) LEFT (Left: Eye)  Patient Location: PACU  Anesthesia Type: MAC  Level of Consciousness: awake, alert  and patient cooperative  Airway and Oxygen Therapy: Patient Spontanous Breathing and Patient connected to supplemental oxygen  Post-op Assessment: Post-op Vital signs reviewed, Patient's Cardiovascular Status Stable, Respiratory Function Stable, Patent Airway and No signs of Nausea or vomiting  Post-op Vital Signs: Reviewed and stable  Complications: No notable events documented.

## 2020-08-12 NOTE — H&P (Signed)
[facility]   Primary Care Physician:  Glean Hess, MD Ophthalmologist: Dr. George Ina  Pre-Procedure History & Physical: HPI:  Dana Cameron is a 75 y.o. female here for cataract surgery.   Past Medical History:  Diagnosis Date   Allergy    Fall Seasonal Allergies   Arthritis of right hip    Cataract    Hyperlipidemia    PONV (postoperative nausea and vomiting)    Vaginal prolapse    Varicose veins of legs    has been treated    Past Surgical History:  Procedure Laterality Date   BREAST BIOPSY Left 1993   neg   COLONOSCOPY  12/31/2003   one hyperplastic polyp   COLONOSCOPY WITH PROPOFOL N/A 01/07/2020   Procedure: COLONOSCOPY WITH PROPOFOL;  Surgeon: Lucilla Lame, MD;  Location: [place];  Service: Endoscopy;  Laterality: N/A;  priority 4   EXCISION VAGINAL CYST     TOTAL KNEE ARTHROPLASTY Right 07/26/2017    Prior to Admission medications   Medication Sig Start Date End Date Taking? Authorizing Provider  cetirizine (ZYRTEC) 10 MG tablet Take 10 mg by mouth daily.   Yes [provider]  Estradiol-Estriol-Progesterone (BIEST/PROGESTERONE) CREA APPLY 1 ML TOPICALLY EVERY DAY OR AS DIRECTED 08/27/19  Yes Glean Hess, MD  metronidazole (NORITATE) 1 % cream Apply topically daily as needed.   Yes [provider]  Nutritional Supplements (JUICE PLUS FIBRE PO) Take by mouth. Takes: Juice Plus fruits, (2 caps AM), Juice Plus Veggies (2 caps PM), Juice Plus berries (1 cap AM & PM)   Yes [provider]  Omega 3 1000 MG CAPS Take 1 capsule by mouth 2 (two) times daily. Juice plus brand with 5 omegas   Yes [provider]    Allergies as of 07/07/2020 - Review Complete 02/04/2020  Allergen Reaction Noted   Other  01/01/2020   Tape  01/01/2020    Family History  Problem Relation Age of Onset   Transient ischemic attack Mother    Stroke Mother    Hypertension Father    Stroke Father    Asthma Sister     Lung cancer Brother    Cancer Brother    Varicose Veins Daughter    Breast cancer Neg Hx     Social History   Socioeconomic History   Marital status: Married    Spouse name: Not on file   Number of children: 2   Years of education: 12   Highest education level: High school graduate  Occupational History   Occupation: retired  Tobacco Use   Smoking status: Never   Smokeless tobacco: Never  Vaping Use   Vaping Use: Never used  Substance and Sexual Activity   Alcohol use: No    Alcohol/week: 0.0 standard drinks   Drug use: No   Sexual activity: Not Currently    Birth control/protection: None  Other Topics Concern   Not on file  Social History Narrative   Pt lives in [place] January, February and March each year, plays golf there   Social Determinants of Radio broadcast assistant Strain: Low Risk    Difficulty of Paying Living Expenses: Not hard at all  Food Insecurity: No Food Insecurity   Worried About Charity fundraiser in the Last Year: Never true   Arboriculturist in the Last Year: Never true  Transportation Needs: No Transportation Needs   Lack of Transportation (Medical): No   Lack of Transportation (  Non-Medical): No  Physical Activity: Inactive   Days of Exercise per Week: 0 days   Minutes of Exercise per Session: 0 min  Stress: No Stress Concern Present   Feeling of Stress : Not at all  Social Connections: Moderately Integrated   Frequency of Communication with Friends and Family: More than three times a week   Frequency of Social Gatherings with Friends and Family: Three times a week   Attends Religious Services: More than 4 times per year   Active Member of Clubs or Organizations: No   Attends Archivist Meetings: Never   Marital Status: Married  Human resources officer Violence: Not At Risk   Fear of Current or Ex-Partner: No   Emotionally Abused: No   Physically Abused: No   Sexually Abused: No    Review of Systems: See HPI, otherwise  negative ROS  Physical Exam: BP 125/67   Pulse 67   Temp (!) 97.5 F (36.4 C) (Temporal)   Resp 18   Ht 5\' 6"  (1.676 m)   Wt 77.1 kg   SpO2 98%   BMI 27.44 kg/m  General:   Alert,  pleasant and cooperative in NAD Head:  Normocephalic and atraumatic. Respiratory:  Normal work of breathing. Cardiovascular:  RRR  Impression/Plan: Dana Cameron is here for cataract surgery.  Risks, benefits, limitations, and alternatives regarding cataract surgery have been reviewed with the patient.  Questions have been answered.  All parties agreeable.   Birder Robson, MD  08/12/2020, 12:16 PM

## 2020-08-12 NOTE — Op Note (Signed)
PREOPERATIVE DIAGNOSIS:  Nuclear sclerotic cataract of the left eye.   POSTOPERATIVE DIAGNOSIS:  Nuclear sclerotic cataract of the left eye.   OPERATIVE PROCEDURE:ORPROCALL@   SURGEON:  Birder Robson, MD.   ANESTHESIA:  Anesthesiologist: Alisa Graff, MD CRNA: Mayme Genta, CRNA  1.      Managed anesthesia care. 2.     0.72ml of Shugarcaine was instilled following the paracentesis   COMPLICATIONS:  None.   TECHNIQUE:   Stop and chop   DESCRIPTION OF PROCEDURE:  The patient was examined and consented in the preoperative holding area where the aforementioned topical anesthesia was applied to the left eye and then brought back to the Operating Room where the left eye was prepped and draped in the usual sterile ophthalmic fashion and a lid speculum was placed. A paracentesis was created with the side port blade and the anterior chamber was filled with viscoelastic. A near clear corneal incision was performed with the steel keratome. A continuous curvilinear capsulorrhexis was performed with a cystotome followed by the capsulorrhexis forceps. Hydrodissection and hydrodelineation were carried out with BSS on a blunt cannula. The lens was removed in a stop and chop  technique and the remaining cortical material was removed with the irrigation-aspiration handpiece. The capsular bag was inflated with viscoelastic and the Technis [id number] lens was placed in the capsular bag without complication. The remaining viscoelastic was removed from the eye with the irrigation-aspiration handpiece. The wounds were hydrated. The anterior chamber was flushed with BSS and the eye was inflated to physiologic pressure. 0.108ml Vigamox was placed in the anterior chamber. The wounds were found to be water tight. The eye was dressed with Combigan. The patient was given protective glasses to wear throughout the day and a shield with which to sleep tonight. The patient was also given drops with which to begin a drop regimen  today and will follow-up with me in one day. Implant Name Type Inv. Item Serial No. Manufacturer Lot No. LRB No. Used Action  LENS IOL TECNIS EYHANCE 22.0 - [id number] Intraocular Lens LENS IOL TECNIS EYHANCE 22.0 [id number] JOHNSON   Left 1 Implanted    Procedure(s) with comments: CATARACT EXTRACTION PHACO AND INTRAOCULAR LENS PLACEMENT (IOC) LEFT (Left) - 6.89 00:41.2  Electronically signed: Birder Robson 08/12/2020 12:41 PM

## 2020-08-12 NOTE — Anesthesia Procedure Notes (Signed)
Procedure Name: MAC Date/Time: 08/12/2020 12:30 PM Performed by: Mayme Genta, CRNA Pre-anesthesia Checklist: Patient identified, Emergency Drugs available, Suction available, Timeout performed and Patient being monitored Patient Re-evaluated:Patient Re-evaluated prior to induction Oxygen Delivery Method: Nasal cannula Placement Confirmation: positive ETCO2

## 2020-08-12 NOTE — Anesthesia Preprocedure Evaluation (Signed)
Anesthesia Evaluation  Patient identified by MRN, date of birth, ID band Patient awake    Reviewed: Allergy & Precautions, H&P , NPO status , Patient's Chart, lab work & pertinent test results, reviewed documented beta blocker date and time   History of Anesthesia Complications (+) PONV and history of anesthetic complications  Airway Mallampati: II  TM Distance: >3 FB Neck ROM: full    Dental no notable dental hx.    Pulmonary neg pulmonary ROS,    Pulmonary exam normal breath sounds clear to auscultation       Cardiovascular Exercise Tolerance: Good negative cardio ROS   Rhythm:regular Rate:Normal     Neuro/Psych negative neurological ROS  negative psych ROS   GI/Hepatic negative GI ROS, Neg liver ROS,   Endo/Other  negative endocrine ROS  Renal/GU negative Renal ROS  negative genitourinary   Musculoskeletal   Abdominal   Peds  Hematology negative hematology ROS (+)   Anesthesia Other Findings   Reproductive/Obstetrics negative OB ROS                             Anesthesia Physical Anesthesia Plan  ASA: 2  Anesthesia Plan: MAC   Post-op Pain Management:    Induction:   PONV Risk Score and Plan: 3 and Treatment may vary due to age or medical condition  Airway Management Planned:   Additional Equipment:   Intra-op Plan:   Post-operative Plan:   Informed Consent: I have reviewed the patients History and Physical, chart, labs and discussed the procedure including the risks, benefits and alternatives for the proposed anesthesia with the patient or authorized representative who has indicated his/her understanding and acceptance.     Dental Advisory Given  Plan Discussed with: CRNA  Anesthesia Plan Comments:         Anesthesia Quick Evaluation

## 2020-08-12 NOTE — Anesthesia Postprocedure Evaluation (Signed)
Anesthesia Post Note  Patient: Dana Cameron  Procedure(s) Performed: CATARACT EXTRACTION PHACO AND INTRAOCULAR LENS PLACEMENT (IOC) LEFT (Left: Eye)     Patient location during evaluation: PACU Anesthesia Type: MAC Level of consciousness: awake and alert Pain management: pain level controlled Vital Signs Assessment: post-procedure vital signs reviewed and stable Respiratory status: spontaneous breathing, nonlabored ventilation, respiratory function stable and patient connected to nasal cannula oxygen Cardiovascular status: stable and blood pressure returned to baseline Postop Assessment: no apparent nausea or vomiting Anesthetic complications: no   No notable events documented.  Alisa Graff

## 2020-08-13 ENCOUNTER — Encounter: Payer: Self-pay | Admitting: Ophthalmology

## 2020-08-18 DIAGNOSIS — H2511 Age-related nuclear cataract, right eye: Secondary | ICD-10-CM | POA: Diagnosis not present

## 2020-09-03 DIAGNOSIS — M9901 Segmental and somatic dysfunction of cervical region: Secondary | ICD-10-CM | POA: Diagnosis not present

## 2020-09-03 DIAGNOSIS — M62830 Muscle spasm of back: Secondary | ICD-10-CM | POA: Diagnosis not present

## 2020-09-03 DIAGNOSIS — M9902 Segmental and somatic dysfunction of thoracic region: Secondary | ICD-10-CM | POA: Diagnosis not present

## 2020-09-03 DIAGNOSIS — M5033 Other cervical disc degeneration, cervicothoracic region: Secondary | ICD-10-CM | POA: Diagnosis not present

## 2020-09-08 ENCOUNTER — Encounter: Payer: Self-pay | Admitting: Ophthalmology

## 2020-09-10 ENCOUNTER — Telehealth: Payer: Self-pay

## 2020-09-10 NOTE — Telephone Encounter (Signed)
Copied from [place] [id number]. Topic: General - Other >> Sep 10, 2020  4:04 PM Loma [place] wrote: Reason for CRM: Estradiol-Estriol-Progesterone (BIEST/PROGESTERONE) CREA 30 g 3 08/27/2019   Sig: APPLY 1 ML TOPICALLY EVERY DAY OR AS DIRECTED  Class: Phone In   Pt had requested and was denied, request refill if need to contact [place], [address] [place] [address] Phone: [phone] Fax: [phone] Hours: Not open 24 hours

## 2020-09-11 ENCOUNTER — Other Ambulatory Visit: Payer: Self-pay | Admitting: Internal Medicine

## 2020-09-11 DIAGNOSIS — Z780 Asymptomatic menopausal state: Secondary | ICD-10-CM

## 2020-09-11 MED ORDER — BIEST/PROGESTERONE TD CREA: TOPICAL_CREAM | TRANSDERMAL | 3 refills | Status: DC

## 2020-09-11 NOTE — Telephone Encounter (Signed)
Spoke to [place] and told her to fill the medication the same ways as in the past. Pharmacy will fill medication separately.   Biest 80/20 HRT cream 0.9 MG  7mL - 18mL every day or as directed  Progesterone 3.5 HRT creams 14mL- 1-2 mL topical daily as directed   KP

## 2020-09-11 NOTE — Telephone Encounter (Signed)
Dana Cameron or Dana Cameron, from [facility], called stating that they received the prescription, ut it is written differently than it usually is. She is needing to have a call back to verify how PCP would like to have this cream done. Please advise.       [phone]

## 2020-09-22 NOTE — Discharge Instructions (Signed)

## 2020-09-23 ENCOUNTER — Ambulatory Visit: Payer: Medicare HMO | Admitting: Anesthesiology

## 2020-09-23 ENCOUNTER — Encounter
Admission: RE | Disposition: A | Discharge status: Home / Self Care | Payer: Self-pay | Source: Home / Self Care | Attending: Ophthalmology

## 2020-09-23 ENCOUNTER — Ambulatory Visit
Admission: RE | Admit: 2020-09-23 | Discharge: 2020-09-23 | Disposition: A | Discharge status: Home / Self Care | Payer: Medicare HMO | Attending: Ophthalmology | Admitting: Ophthalmology

## 2020-09-23 ENCOUNTER — Encounter: Payer: Self-pay | Admitting: Ophthalmology

## 2020-09-23 ENCOUNTER — Other Ambulatory Visit: Payer: Self-pay

## 2020-09-23 DIAGNOSIS — H2512 Age-related nuclear cataract, left eye: Secondary | ICD-10-CM | POA: Insufficient documentation

## 2020-09-23 DIAGNOSIS — H2511 Age-related nuclear cataract, right eye: Secondary | ICD-10-CM | POA: Diagnosis not present

## 2020-09-23 DIAGNOSIS — H25811 Combined forms of age-related cataract, right eye: Secondary | ICD-10-CM | POA: Diagnosis not present

## 2020-09-23 HISTORY — PX: CATARACT EXTRACTION W/PHACO: SHX586

## 2020-09-23 SURGERY — PHACOEMULSIFICATION, CATARACT, WITH IOL INSERTION
Anesthesia: Monitor Anesthesia Care | Site: Eye | Laterality: Right

## 2020-09-23 MED ORDER — MOXIFLOXACIN HCL 0.5 % OP SOLN
OPHTHALMIC | Status: DC | PRN
  Administered 2020-09-23: 0.2 mL via OPHTHALMIC

## 2020-09-23 MED ORDER — SIGHTPATH DOSE#1 NA CHONDROIT SULF-NA HYALURON 40-17 MG/ML IO SOLN
INTRAOCULAR | Status: DC | PRN
  Administered 2020-09-23: 1 mL via INTRAOCULAR

## 2020-09-23 MED ORDER — SIGHTPATH DOSE#1 BSS IO SOLN
INTRAOCULAR | Status: DC | PRN
  Administered 2020-09-23: 44 mL via OPHTHALMIC

## 2020-09-23 MED ORDER — TETRACAINE HCL 0.5 % OP SOLN
1.0000 [drp] | OPHTHALMIC | Status: DC | PRN
  Administered 2020-09-23 (×3): 1 [drp] via OPHTHALMIC

## 2020-09-23 MED ORDER — PHENYLEPHRINE HCL 10 % OP SOLN
1.0000 [drp] | OPHTHALMIC | Status: DC | PRN
  Administered 2020-09-23 (×3): 1 [drp] via OPHTHALMIC

## 2020-09-23 MED ORDER — FENTANYL CITRATE (PF) 100 MCG/2ML IJ SOLN
INTRAMUSCULAR | Status: DC | PRN
  Administered 2020-09-23: 50 ug via INTRAVENOUS

## 2020-09-23 MED ORDER — SIGHTPATH DOSE#1 BSS IO SOLN
INTRAOCULAR | Status: DC | PRN
  Administered 2020-09-23: 1 mL via INTRAMUSCULAR

## 2020-09-23 MED ORDER — SIGHTPATH DOSE#1 BSS IO SOLN
INTRAOCULAR | Status: DC | PRN
  Administered 2020-09-23: 15 mL

## 2020-09-23 MED ORDER — MIDAZOLAM HCL 2 MG/2ML IJ SOLN
INTRAMUSCULAR | Status: DC | PRN
  Administered 2020-09-23: 1 mg via INTRAVENOUS

## 2020-09-23 MED ORDER — BRIMONIDINE TARTRATE-TIMOLOL 0.2-0.5 % OP SOLN
OPHTHALMIC | Status: DC | PRN
  Administered 2020-09-23: 1 [drp] via OPHTHALMIC

## 2020-09-23 MED ORDER — CYCLOPENTOLATE HCL 2 % OP SOLN
1.0000 [drp] | OPHTHALMIC | Status: DC | PRN
  Administered 2020-09-23 (×3): 1 [drp] via OPHTHALMIC

## 2020-09-23 SURGICAL SUPPLY — 18 items
CANNULA ANT/CHMB 27G (MISCELLANEOUS) ×2 IMPLANT
CANNULA ANT/CHMB 27GA (MISCELLANEOUS) ×4 IMPLANT
GLOVE SURG ENC TEXT LTX SZ8 (GLOVE) ×2 IMPLANT
GLOVE SURG TRIUMPH 8.0 PF LTX (GLOVE) ×2 IMPLANT
GOWN STRL REUS W/ TWL LRG LVL3 (GOWN DISPOSABLE) ×2 IMPLANT
GOWN STRL REUS W/TWL LRG LVL3 (GOWN DISPOSABLE) ×4
LENS IOL TECNIS EYHANCE 21.5 (Intraocular Lens) ×1 IMPLANT
MARKER SKIN DUAL TIP RULER LAB (MISCELLANEOUS) ×2 IMPLANT
NDL FILTER BLUNT 18X1 1/2 (NEEDLE) ×1 IMPLANT
NEEDLE FILTER BLUNT 18X 1/2SAF (NEEDLE) ×1
NEEDLE FILTER BLUNT 18X1 1/2 (NEEDLE) ×1 IMPLANT
PACK EYE AFTER SURG (MISCELLANEOUS) ×2 IMPLANT
SUT ETHILON 10-0 CS-B-6CS-B-6 (SUTURE)
SUTURE EHLN 10-0 CS-B-6CS-B-6 (SUTURE) IMPLANT
SYR 3ML LL SCALE MARK (SYRINGE) ×2 IMPLANT
SYR TB 1ML LUER SLIP (SYRINGE) ×2 IMPLANT
WATER STERILE IRR 250ML POUR (IV SOLUTION) ×2 IMPLANT
WIPE NON LINTING 3.25X3.25 (MISCELLANEOUS) ×2 IMPLANT

## 2020-09-23 NOTE — Transfer of Care (Signed)
Immediate Anesthesia Transfer of Care Note  Patient: Dana Cameron  Procedure(s) Performed: CATARACT EXTRACTION PHACO AND INTRAOCULAR LENS PLACEMENT (IOC) RIGHT 3.01 00:22.6  (Right: Eye)  Patient Location: PACU  Anesthesia Type: MAC  Level of Consciousness: awake, alert  and patient cooperative  Airway and Oxygen Therapy: Patient Spontanous Breathing and Patient connected to supplemental oxygen  Post-op Assessment: Post-op Vital signs reviewed, Patient's Cardiovascular Status Stable, Respiratory Function Stable, Patent Airway and No signs of Nausea or vomiting  Post-op Vital Signs: Reviewed and stable  Complications: No notable events documented.

## 2020-09-23 NOTE — Anesthesia Procedure Notes (Signed)
Procedure Name: MAC Date/Time: 09/23/2020 12:17 PM Performed by: Dionne Bucy, CRNA Pre-anesthesia Checklist: Patient identified, Emergency Drugs available, Suction available, Patient being monitored and Timeout performed Patient Re-evaluated:Patient Re-evaluated prior to induction Oxygen Delivery Method: Nasal cannula Placement Confirmation: positive ETCO2

## 2020-09-23 NOTE — Anesthesia Postprocedure Evaluation (Signed)
Anesthesia Post Note  Patient: Dana Cameron  Procedure(s) Performed: CATARACT EXTRACTION PHACO AND INTRAOCULAR LENS PLACEMENT (IOC) RIGHT 3.01 00:22.6  (Right: Eye)     Patient location during evaluation: PACU Anesthesia Type: MAC Level of consciousness: awake Pain management: pain level controlled Vital Signs Assessment: post-procedure vital signs reviewed and stable Respiratory status: respiratory function stable Cardiovascular status: stable Postop Assessment: no apparent nausea or vomiting Anesthetic complications: no   No notable events documented.  Veda Canning

## 2020-09-23 NOTE — Op Note (Signed)
PREOPERATIVE DIAGNOSIS:  Nuclear sclerotic cataract of the left eye.   POSTOPERATIVE DIAGNOSIS:  Nuclear sclerotic cataract of the left eye.   OPERATIVE PROCEDURE:ORPROCALL@   SURGEON:  Birder Robson, MD.   ANESTHESIA:  Anesthesiologist: Veda Canning, MD CRNA: Dionne Bucy, CRNA  1.      Managed anesthesia care. 2.     0.48m of Shugarcaine was instilled following the paracentesis   COMPLICATIONS:  None.   TECHNIQUE:   Stop and chop   DESCRIPTION OF PROCEDURE:  The patient was examined and consented in the preoperative holding area where the aforementioned topical anesthesia was applied to the left eye and then brought back to the Operating Room where the left eye was prepped and draped in the usual sterile ophthalmic fashion and a lid speculum was placed. A paracentesis was created with the side port blade and the anterior chamber was filled with viscoelastic. A near clear corneal incision was performed with the steel keratome. A continuous curvilinear capsulorrhexis was performed with a cystotome followed by the capsulorrhexis forceps. Hydrodissection and hydrodelineation were carried out with BSS on a blunt cannula. The lens was removed in a stop and chop  technique and the remaining cortical material was removed with the irrigation-aspiration handpiece. The capsular bag was inflated with viscoelastic and the Technis [id number] lens was placed in the capsular bag without complication. The remaining viscoelastic was removed from the eye with the irrigation-aspiration handpiece. The wounds were hydrated. The anterior chamber was flushed with BSS and the eye was inflated to physiologic pressure. 0.146mVigamox was placed in the anterior chamber. The wounds were found to be water tight. The eye was dressed with Combigan. The patient was given protective glasses to wear throughout the day and a shield with which to sleep tonight. The patient was also given drops with which to begin a drop regimen  today and will follow-up with me in one day. Implant Name Type Inv. Item Serial No. Manufacturer Lot No. LRB No. Used Action  DIBOO 21.5D   [id number] JOHNSON  Right 1 Implanted    Procedure(s): CATARACT EXTRACTION PHACO AND INTRAOCULAR LENS PLACEMENT (IOC) RIGHT 3.01 00:22.6  (Right)  Electronically signed: WiBirder Robson/26/2022 12:33 PM

## 2020-09-23 NOTE — H&P (Signed)
[facility]   Primary Care Physician:  Glean Hess, MD Ophthalmologist: Dr. George Ina  Pre-Procedure History & Physical: HPI:  Dana Cameron is a 75 y.o. female here for cataract surgery.   Past Medical History:  Diagnosis Date   Allergy    Fall Seasonal Allergies   Arthritis of right hip    Cataract    Hyperlipidemia    PONV (postoperative nausea and vomiting)    Vaginal prolapse    Varicose veins of legs    has been treated    Past Surgical History:  Procedure Laterality Date   BREAST BIOPSY Left 1993   neg   CATARACT EXTRACTION W/PHACO Left 08/12/2020   Procedure: CATARACT EXTRACTION PHACO AND INTRAOCULAR LENS PLACEMENT ([place]) LEFT;  Surgeon: Birder Robson, MD;  Location: [place];  Service: Ophthalmology;  Laterality: Left;  6.89 00:41.2   COLONOSCOPY  12/31/2003   one hyperplastic polyp   COLONOSCOPY WITH PROPOFOL N/A 01/07/2020   Procedure: COLONOSCOPY WITH PROPOFOL;  Surgeon: Lucilla Lame, MD;  Location: [place];  Service: Endoscopy;  Laterality: N/A;  priority 4   EXCISION VAGINAL CYST     TOTAL KNEE ARTHROPLASTY Right 07/26/2017    Prior to Admission medications   Medication Sig Start Date End Date Taking? Authorizing Provider  cetirizine (ZYRTEC) 10 MG tablet Take 10 mg by mouth daily.   Yes [provider]  Nutritional Supplements (JUICE PLUS FIBRE PO) Take by mouth. Takes: Juice Plus fruits, (2 caps AM), Juice Plus Veggies (2 caps PM), Juice Plus berries (1 cap AM & PM)   Yes [provider]  Omega 3 1000 MG CAPS Take 1 capsule by mouth 2 (two) times daily. Juice plus brand with 5 omegas   Yes [provider]  Estradiol-Estriol-Progesterone (BIEST/PROGESTERONE) CREA APPLY 1 ML TOPICALLY EVERY DAY OR AS DIRECTED 09/11/20   Glean Hess, MD  metronidazole (NORITATE) 1 % cream Apply topically daily as needed.    [provider]    Allergies as of 08/15/2020 - Review Complete  08/12/2020  Allergen Reaction Noted   Other  01/01/2020   Tape  01/01/2020    Family History  Problem Relation Age of Onset   Transient ischemic attack Mother    Stroke Mother    Hypertension Father    Stroke Father    Asthma Sister    Lung cancer Brother    Cancer Brother    Varicose Veins Daughter    Breast cancer Neg Hx     Social History   Socioeconomic History   Marital status: Married    Spouse name: Not on file   Number of children: 2   Years of education: 12   Highest education level: High school graduate  Occupational History   Occupation: retired  Tobacco Use   Smoking status: Never   Smokeless tobacco: Never  Vaping Use   Vaping Use: Never used  Substance and Sexual Activity   Alcohol use: No    Alcohol/week: 0.0 standard drinks   Drug use: No   Sexual activity: Not Currently    Birth control/protection: None  Other Topics Concern   Not on file  Social History Narrative   Pt lives in [place] January, February and March each year, plays golf there   Social Determinants of Radio broadcast assistant Strain: Low Risk    Difficulty of Paying Living Expenses: Not hard at all  Food Insecurity: No Food Insecurity   Worried About Estate manager/land agent  of Food in the Last Year: Never true   [place] in the Last Year: Never true  Transportation Needs: No Transportation Needs   Lack of Transportation (Medical): No   Lack of Transportation (Non-Medical): No  Physical Activity: Inactive   Days of Exercise per Week: 0 days   Minutes of Exercise per Session: 0 min  Stress: No Stress Concern Present   Feeling of Stress : Not at all  Social Connections: Moderately Integrated   Frequency of Communication with Friends and Family: More than three times a week   Frequency of Social Gatherings with Friends and Family: Three times a week   Attends Religious Services: More than 4 times per year   Active Member of Clubs or Organizations: No   Attends Theatre manager Meetings: Never   Marital Status: Married  Human resources officer Violence: Not At Risk   Fear of Current or Ex-Partner: No   Emotionally Abused: No   Physically Abused: No   Sexually Abused: No    Review of Systems: See HPI, otherwise negative ROS  Physical Exam: BP (!) 155/85   Pulse 60   Temp (!) 97.2 F (36.2 C) (Temporal)   Ht '5\' 6"'$  (1.676 m)   Wt 77.1 kg   SpO2 98%   BMI 27.44 kg/m  General:   Alert, cooperative in NAD Head:  Normocephalic and atraumatic. Respiratory:  Normal work of breathing. Cardiovascular:  RRR  Impression/Plan: Dana Cameron is here for cataract surgery.  Risks, benefits, limitations, and alternatives regarding cataract surgery have been reviewed with the patient.  Questions have been answered.  All parties agreeable.   Birder Robson, MD  09/23/2020, 12:06 PM

## 2020-09-23 NOTE — Anesthesia Preprocedure Evaluation (Signed)
Anesthesia Evaluation  Patient identified by MRN, date of birth, ID band Patient awake    Reviewed: Allergy & Precautions, H&P , NPO status   History of Anesthesia Complications (+) PONV and history of anesthetic complications  Airway Mallampati: II  TM Distance: >3 FB Neck ROM: full    Dental no notable dental hx.    Pulmonary neg pulmonary ROS,    breath sounds clear to auscultation       Cardiovascular Exercise Tolerance: Good  Rhythm:regular Rate:Normal  HLD   Neuro/Psych negative psych ROS   GI/Hepatic negative GI ROS,   Endo/Other    Renal/GU      Musculoskeletal  (+) Arthritis ,   Abdominal   Peds  Hematology   Anesthesia Other Findings   Reproductive/Obstetrics                             Anesthesia Physical  Anesthesia Plan  ASA: 2  Anesthesia Plan: MAC   Post-op Pain Management:    Induction:   PONV Risk Score and Plan: 3 and Treatment may vary due to age or medical condition, TIVA and Midazolam  Airway Management Planned:   Additional Equipment:   Intra-op Plan:   Post-operative Plan:   Informed Consent: I have reviewed the patients History and Physical, chart, labs and discussed the procedure including the risks, benefits and alternatives for the proposed anesthesia with the patient or authorized representative who has indicated his/her understanding and acceptance.     Dental Advisory Given  Plan Discussed with: CRNA  Anesthesia Plan Comments:         Anesthesia Quick Evaluation

## 2020-09-24 ENCOUNTER — Encounter: Payer: Self-pay | Admitting: Ophthalmology

## 2020-10-01 DIAGNOSIS — M9901 Segmental and somatic dysfunction of cervical region: Secondary | ICD-10-CM | POA: Diagnosis not present

## 2020-10-01 DIAGNOSIS — M9902 Segmental and somatic dysfunction of thoracic region: Secondary | ICD-10-CM | POA: Diagnosis not present

## 2020-10-01 DIAGNOSIS — M62830 Muscle spasm of back: Secondary | ICD-10-CM | POA: Diagnosis not present

## 2020-10-01 DIAGNOSIS — M5033 Other cervical disc degeneration, cervicothoracic region: Secondary | ICD-10-CM | POA: Diagnosis not present

## 2020-10-27 DIAGNOSIS — M62830 Muscle spasm of back: Secondary | ICD-10-CM | POA: Diagnosis not present

## 2020-10-27 DIAGNOSIS — M5033 Other cervical disc degeneration, cervicothoracic region: Secondary | ICD-10-CM | POA: Diagnosis not present

## 2020-10-27 DIAGNOSIS — M9901 Segmental and somatic dysfunction of cervical region: Secondary | ICD-10-CM | POA: Diagnosis not present

## 2020-10-27 DIAGNOSIS — M9902 Segmental and somatic dysfunction of thoracic region: Secondary | ICD-10-CM | POA: Diagnosis not present

## 2020-10-29 DIAGNOSIS — M62830 Muscle spasm of back: Secondary | ICD-10-CM | POA: Diagnosis not present

## 2020-10-29 DIAGNOSIS — M9902 Segmental and somatic dysfunction of thoracic region: Secondary | ICD-10-CM | POA: Diagnosis not present

## 2020-10-29 DIAGNOSIS — M9901 Segmental and somatic dysfunction of cervical region: Secondary | ICD-10-CM | POA: Diagnosis not present

## 2020-10-29 DIAGNOSIS — M5033 Other cervical disc degeneration, cervicothoracic region: Secondary | ICD-10-CM | POA: Diagnosis not present

## 2020-10-30 DIAGNOSIS — Z0100 Encounter for examination of eyes and vision without abnormal findings: Secondary | ICD-10-CM | POA: Diagnosis not present

## 2020-10-31 DIAGNOSIS — M5033 Other cervical disc degeneration, cervicothoracic region: Secondary | ICD-10-CM | POA: Diagnosis not present

## 2020-10-31 DIAGNOSIS — M9902 Segmental and somatic dysfunction of thoracic region: Secondary | ICD-10-CM | POA: Diagnosis not present

## 2020-10-31 DIAGNOSIS — M9901 Segmental and somatic dysfunction of cervical region: Secondary | ICD-10-CM | POA: Diagnosis not present

## 2020-10-31 DIAGNOSIS — M62830 Muscle spasm of back: Secondary | ICD-10-CM | POA: Diagnosis not present

## 2020-11-05 DIAGNOSIS — M62830 Muscle spasm of back: Secondary | ICD-10-CM | POA: Diagnosis not present

## 2020-11-05 DIAGNOSIS — M9902 Segmental and somatic dysfunction of thoracic region: Secondary | ICD-10-CM | POA: Diagnosis not present

## 2020-11-05 DIAGNOSIS — M9901 Segmental and somatic dysfunction of cervical region: Secondary | ICD-10-CM | POA: Diagnosis not present

## 2020-11-05 DIAGNOSIS — M5033 Other cervical disc degeneration, cervicothoracic region: Secondary | ICD-10-CM | POA: Diagnosis not present

## 2020-11-07 DIAGNOSIS — M62830 Muscle spasm of back: Secondary | ICD-10-CM | POA: Diagnosis not present

## 2020-11-07 DIAGNOSIS — M9902 Segmental and somatic dysfunction of thoracic region: Secondary | ICD-10-CM | POA: Diagnosis not present

## 2020-11-07 DIAGNOSIS — M5033 Other cervical disc degeneration, cervicothoracic region: Secondary | ICD-10-CM | POA: Diagnosis not present

## 2020-11-07 DIAGNOSIS — M9901 Segmental and somatic dysfunction of cervical region: Secondary | ICD-10-CM | POA: Diagnosis not present

## 2020-11-12 DIAGNOSIS — M62830 Muscle spasm of back: Secondary | ICD-10-CM | POA: Diagnosis not present

## 2020-11-12 DIAGNOSIS — M5033 Other cervical disc degeneration, cervicothoracic region: Secondary | ICD-10-CM | POA: Diagnosis not present

## 2020-11-12 DIAGNOSIS — M9901 Segmental and somatic dysfunction of cervical region: Secondary | ICD-10-CM | POA: Diagnosis not present

## 2020-11-12 DIAGNOSIS — M9902 Segmental and somatic dysfunction of thoracic region: Secondary | ICD-10-CM | POA: Diagnosis not present

## 2020-11-19 DIAGNOSIS — M9902 Segmental and somatic dysfunction of thoracic region: Secondary | ICD-10-CM | POA: Diagnosis not present

## 2020-11-19 DIAGNOSIS — M62830 Muscle spasm of back: Secondary | ICD-10-CM | POA: Diagnosis not present

## 2020-11-19 DIAGNOSIS — M5033 Other cervical disc degeneration, cervicothoracic region: Secondary | ICD-10-CM | POA: Diagnosis not present

## 2020-11-19 DIAGNOSIS — M9901 Segmental and somatic dysfunction of cervical region: Secondary | ICD-10-CM | POA: Diagnosis not present

## 2020-11-26 DIAGNOSIS — M62830 Muscle spasm of back: Secondary | ICD-10-CM | POA: Diagnosis not present

## 2020-11-26 DIAGNOSIS — M9902 Segmental and somatic dysfunction of thoracic region: Secondary | ICD-10-CM | POA: Diagnosis not present

## 2020-11-26 DIAGNOSIS — M5033 Other cervical disc degeneration, cervicothoracic region: Secondary | ICD-10-CM | POA: Diagnosis not present

## 2020-11-26 DIAGNOSIS — M9901 Segmental and somatic dysfunction of cervical region: Secondary | ICD-10-CM | POA: Diagnosis not present

## 2020-12-15 DIAGNOSIS — H903 Sensorineural hearing loss, bilateral: Secondary | ICD-10-CM | POA: Diagnosis not present

## 2020-12-15 DIAGNOSIS — J301 Allergic rhinitis due to pollen: Secondary | ICD-10-CM | POA: Diagnosis not present

## 2020-12-24 DIAGNOSIS — M5033 Other cervical disc degeneration, cervicothoracic region: Secondary | ICD-10-CM | POA: Diagnosis not present

## 2020-12-24 DIAGNOSIS — M9901 Segmental and somatic dysfunction of cervical region: Secondary | ICD-10-CM | POA: Diagnosis not present

## 2020-12-24 DIAGNOSIS — M9902 Segmental and somatic dysfunction of thoracic region: Secondary | ICD-10-CM | POA: Diagnosis not present

## 2020-12-24 DIAGNOSIS — M62830 Muscle spasm of back: Secondary | ICD-10-CM | POA: Diagnosis not present

## 2021-01-07 ENCOUNTER — Encounter: Payer: Self-pay | Admitting: Internal Medicine

## 2021-01-07 ENCOUNTER — Other Ambulatory Visit: Payer: Self-pay

## 2021-01-07 ENCOUNTER — Ambulatory Visit (INDEPENDENT_AMBULATORY_CARE_PROVIDER_SITE_OTHER): Payer: Medicare HMO | Admitting: Internal Medicine

## 2021-01-07 VITALS — BP 128/78 | HR 68 | Temp 97.9°F | Ht 66.0 in | Wt 167.4 lb

## 2021-01-07 DIAGNOSIS — J069 Acute upper respiratory infection, unspecified: Secondary | ICD-10-CM

## 2021-01-07 MED ORDER — AZITHROMYCIN 250 MG PO TABS: ORAL_TABLET | ORAL | 0 refills | Status: AC

## 2021-01-07 MED ORDER — ALBUTEROL SULFATE HFA 108 (90 BASE) MCG/ACT IN AERS: 2.0000 | INHALATION_SPRAY | Freq: Four times a day (QID) | RESPIRATORY_TRACT | 0 refills | Status: DC | PRN

## 2021-01-07 NOTE — Progress Notes (Signed)
Date:  01/07/2021   Name:  Dana Cameron   DOB:  02-17-1946   MRN:  [id number]   Chief Complaint: Cough  Cough This is a new problem. The current episode started in the past 7 days. The problem has been waxing and waning. The cough is Productive of sputum. Associated symptoms include nasal congestion, postnasal drip and wheezing. Pertinent negatives include no chest pain, chills, ear pain or fever. Shortness of breath: dark yellow and clear in color.Associated symptoms comments: Sneezing, Ear Pressure.   Lab Results  Component Value Date   CREATININE 0.9 07/04/2018   BUN 15 07/04/2018   NA 144 07/04/2018   K 4.2 07/04/2018   CL 104 01/30/2018   CO2 24 01/30/2018   Lab Results  Component Value Date   CHOL 170 01/30/2018   HDL 58 01/30/2018   LDLCALC 84 01/30/2018   TRIG 140 01/30/2018   CHOLHDL 2.9 01/30/2018   Lab Results  Component Value Date   TSH 2.850 01/30/2018   No results found for: HGBA1C Lab Results  Component Value Date   WBC 4.2 07/04/2018   HGB 14.6 07/04/2018   HCT 43 07/04/2018   MCV 93 01/30/2018   PLT 268 07/04/2018   Lab Results  Component Value Date   ALT 12 01/30/2018   AST 15 01/30/2018   ALKPHOS 85 01/30/2018   BILITOT 0.5 01/30/2018     Review of Systems  Constitutional:  Negative for chills, fatigue and fever.  HENT:  Positive for congestion, postnasal drip and sinus pressure. Negative for ear pain and trouble swallowing.   Respiratory:  Positive for cough and wheezing. Shortness of breath: dark yellow and clear in color.  Cardiovascular:  Negative for chest pain and palpitations.   Patient Active Problem List   Diagnosis Date Noted   Special screening for malignant neoplasms, colon    History of nonmelanoma skin cancer 12/31/2019   Vitamin B 12 deficiency 08/01/2018   Varicose vein of leg 07/11/2018   Colon polyp, hyperplastic 01/28/2017   Arthritis of right hip 01/26/2016   Hyperlipidemia, mild 03/28/2015   Fibrocystic  breast 09/17/2014   Environmental and seasonal allergies 09/17/2014   Pelvic prolapse 09/17/2014   Hot flash, menopausal 09/17/2014    Allergies  Allergen Reactions   Other     Neosporin ophthalmic caused red, burning eyes   Tape     bandaids cause red skin    Past Surgical History:  Procedure Laterality Date   BREAST BIOPSY Left 1993   neg   CATARACT EXTRACTION W/PHACO Left 08/12/2020   Procedure: CATARACT EXTRACTION PHACO AND INTRAOCULAR LENS PLACEMENT ([place]) LEFT;  Surgeon: Birder Robson, MD;  Location: [facility];  Service: Ophthalmology;  Laterality: Left;  6.89 00:41.2   CATARACT EXTRACTION W/PHACO Right 09/23/2020   Procedure: CATARACT EXTRACTION PHACO AND INTRAOCULAR LENS PLACEMENT ([facility]) RIGHT 3.01 00:22.6 ;  Surgeon: Birder Robson, MD;  Location: [facility];  Service: Ophthalmology;  Laterality: Right;   COLONOSCOPY  12/31/2003   one hyperplastic polyp   COLONOSCOPY WITH PROPOFOL N/A 01/07/2020   Procedure: COLONOSCOPY WITH PROPOFOL;  Surgeon: Lucilla Lame, MD;  Location: [facility];  Service: Endoscopy;  Laterality: N/A;  priority 4   EXCISION VAGINAL CYST     TOTAL KNEE ARTHROPLASTY Right 07/26/2017    Social History   Tobacco Use   Smoking status: Never   Smokeless tobacco: Never  Vaping Use   Vaping Use: Never used  Substance Use Topics  Alcohol use: No    Alcohol/week: 0.0 standard drinks   Drug use: No     Medication list has been reviewed and updated.  Current Meds  Medication Sig   cetirizine (ZYRTEC) 10 MG tablet Take 10 mg by mouth daily.   Estradiol-Estriol-Progesterone (BIEST/PROGESTERONE) CREA APPLY 1 ML TOPICALLY EVERY DAY OR AS DIRECTED   metronidazole (NORITATE) 1 % cream Apply topically daily as needed.   Nutritional Supplements (JUICE PLUS FIBRE PO) Take by mouth. Takes: Juice Plus fruits, (2 caps AM), Juice Plus Veggies (2 caps PM), Juice Plus berries (1 cap AM & PM)   Omega 3 1000 MG CAPS Take 1 capsule  by mouth 2 (two) times daily. Juice plus brand with 5 omegas    PHQ 2/9 Scores 01/07/2021 02/04/2020 08/23/2019 01/31/2019  PHQ - 2 Score 0 0 0 0  PHQ- 9 Score 0 - 0 -    GAD 7 : Generalized Anxiety Score 01/07/2021 08/23/2019  Nervous, Anxious, on Edge 0 0  Control/stop worrying 0 0  Worry too much - different things 0 0  Trouble relaxing 0 0  Restless 0 0  Easily annoyed or irritable 0 0  Afraid - awful might happen 0 0  Total GAD 7 Score 0 0  Anxiety Difficulty Not difficult at all Not difficult at all    BP Readings from Last 3 Encounters:  01/07/21 128/78  09/23/20 129/75  08/12/20 133/78    Physical Exam Vitals and nursing note reviewed.  Constitutional:      General: She is not in acute distress.    Appearance: Normal appearance. She is well-developed.  HENT:     Head: Normocephalic and atraumatic.     Right Ear: Tympanic membrane and ear canal normal.     Left Ear: Tympanic membrane and ear canal normal.     Nose:     Right Sinus: No maxillary sinus tenderness or frontal sinus tenderness.     Left Sinus: No maxillary sinus tenderness or frontal sinus tenderness.     Mouth/Throat:     Pharynx: No oropharyngeal exudate or posterior oropharyngeal erythema.  Cardiovascular:     Rate and Rhythm: Normal rate and regular rhythm.  Pulmonary:     Effort: Pulmonary effort is normal. No respiratory distress.     Breath sounds: No wheezing or rhonchi.  Musculoskeletal:     Cervical back: Normal range of motion.  Lymphadenopathy:     Cervical: No cervical adenopathy.  Skin:    General: Skin is warm and dry.     Findings: No rash.  Neurological:     Mental Status: She is alert and oriented to person, place, and time.  Psychiatric:        Mood and Affect: Mood normal.        Behavior: Behavior normal.    Wt Readings from Last 3 Encounters:  01/07/21 167 lb 6.4 oz (75.9 kg)  09/23/20 170 lb (77.1 kg)  08/12/20 170 lb (77.1 kg)    BP 128/78   Pulse 68   Temp 97.9  F (36.6 C) (Oral)   Ht 5\' 6"  (1.676 m)   Wt 167 lb 6.4 oz (75.9 kg)   SpO2 98%   BMI 27.02 kg/m   Assessment and Plan: 1. URI with cough and congestion Following viral infection vs seasonal allergies Continue fluids, decongestants as needed. Follow up if no improvement - azithromycin (ZITHROMAX Z-PAK) 250 MG tablet; UAD  Dispense: 6 each; Refill: 0 - albuterol (VENTOLIN HFA) 108 (  90 Base) MCG/ACT inhaler; Inhale 2 puffs into the lungs every 6 (six) hours as needed for wheezing or shortness of breath.  Dispense: 1 each; Refill: 0   Partially dictated using Editor, commissioning. Any errors are unintentional.  Halina Maidens, MD [facility]  01/07/2021

## 2021-01-07 NOTE — Patient Instructions (Signed)
Use inhaler 1-2 puffs three times a day as needed

## 2021-01-21 DIAGNOSIS — M5033 Other cervical disc degeneration, cervicothoracic region: Secondary | ICD-10-CM | POA: Diagnosis not present

## 2021-01-21 DIAGNOSIS — M9901 Segmental and somatic dysfunction of cervical region: Secondary | ICD-10-CM | POA: Diagnosis not present

## 2021-01-21 DIAGNOSIS — M62830 Muscle spasm of back: Secondary | ICD-10-CM | POA: Diagnosis not present

## 2021-01-21 DIAGNOSIS — M9902 Segmental and somatic dysfunction of thoracic region: Secondary | ICD-10-CM | POA: Diagnosis not present

## 2021-02-04 ENCOUNTER — Ambulatory Visit: Payer: Medicare HMO

## 2021-02-16 ENCOUNTER — Ambulatory Visit (INDEPENDENT_AMBULATORY_CARE_PROVIDER_SITE_OTHER): Payer: Medicare HMO

## 2021-02-16 ENCOUNTER — Other Ambulatory Visit: Payer: Self-pay

## 2021-02-16 ENCOUNTER — Encounter: Payer: Self-pay | Admitting: Internal Medicine

## 2021-02-16 ENCOUNTER — Ambulatory Visit (INDEPENDENT_AMBULATORY_CARE_PROVIDER_SITE_OTHER): Payer: Medicare HMO | Admitting: Internal Medicine

## 2021-02-16 VITALS — BP 138/86 | HR 63 | Temp 98.1°F | Ht 66.0 in | Wt 171.2 lb

## 2021-02-16 DIAGNOSIS — Z0000 Encounter for general adult medical examination without abnormal findings: Secondary | ICD-10-CM | POA: Diagnosis not present

## 2021-02-16 DIAGNOSIS — J0100 Acute maxillary sinusitis, unspecified: Secondary | ICD-10-CM

## 2021-02-16 DIAGNOSIS — J069 Acute upper respiratory infection, unspecified: Secondary | ICD-10-CM | POA: Diagnosis not present

## 2021-02-16 LAB — POC COVID19 BINAXNOW: SARS Coronavirus 2 Ag: NEGATIVE

## 2021-02-16 MED ORDER — DOXYCYCLINE HYCLATE 100 MG PO TABS: 100.0000 mg | ORAL_TABLET | Freq: Two times a day (BID) | ORAL | 0 refills | Status: AC

## 2021-02-16 NOTE — Patient Instructions (Signed)
Dana Cameron , Thank you for taking time to come for your Medicare Wellness Visit. I appreciate your ongoing commitment to your health goals. Please review the following plan we discussed and let me know if I can assist you in the future.   Screening recommendations/referrals: Colonoscopy: done 01/07/20 Mammogram: done 06/12/20 Bone Density: done 02/20/19 Recommended yearly ophthalmology/optometry visit for glaucoma screening and checkup Recommended yearly dental visit for hygiene and checkup  Vaccinations: Influenza vaccine: done 01/28/21 Pneumococcal vaccine: done 01/21/15 Tdap vaccine: due Shingles vaccine: Shingrix discussed. Please contact your pharmacy for coverage information.  Covid-19: done 06/21/19, 07/24/19, 02/05/20 & 08/20/20  Advanced directives: Please bring a copy of your health care power of attorney and living will to the office at your convenience.   Conditions/risks identified: Recommend drinking 6-8 glasses of water per day   Next appointment: Follow up in one year for your annual wellness visit    Preventive Care 65 Years and Older, Female Preventive care refers to lifestyle choices and visits with your health care provider that can promote health and wellness. What does preventive care include? A yearly physical exam. This is also called an annual well check. Dental exams once or twice a year. Routine eye exams. Ask your health care provider how often you should have your eyes checked. Personal lifestyle choices, including: Daily care of your teeth and gums. Regular physical activity. Eating a healthy diet. Avoiding tobacco and drug use. Limiting alcohol use. Practicing safe sex. Taking low-dose aspirin every day. Taking vitamin and mineral supplements as recommended by your health care provider. What happens during an annual well check? The services and screenings done by your health care provider during your annual well check will depend on your age, overall  health, lifestyle risk factors, and family history of disease. Counseling  Your health care provider may ask you questions about your: Alcohol use. Tobacco use. Drug use. Emotional well-being. Home and relationship well-being. Sexual activity. Eating habits. History of falls. Memory and ability to understand (cognition). Work and work Statistician. Reproductive health. Screening  You may have the following tests or measurements: Height, weight, and BMI. Blood pressure. Lipid and cholesterol levels. These may be checked every 5 years, or more frequently if you are over 69 years old. Skin check. Lung cancer screening. You may have this screening every year starting at age 43 if you have a 30-pack-year history of smoking and currently smoke or have quit within the past 15 years. Fecal occult blood test (FOBT) of the stool. You may have this test every year starting at age 50. Flexible sigmoidoscopy or colonoscopy. You may have a sigmoidoscopy every 5 years or a colonoscopy every 10 years starting at age 63. Hepatitis C blood test. Hepatitis B blood test. Sexually transmitted disease (STD) testing. Diabetes screening. This is done by checking your blood sugar (glucose) after you have not eaten for a while (fasting). You may have this done every 1-3 years. Bone density scan. This is done to screen for osteoporosis. You may have this done starting at age 52. Mammogram. This may be done every 1-2 years. Talk to your health care provider about how often you should have regular mammograms. Talk with your health care provider about your test results, treatment options, and if necessary, the need for more tests. Vaccines  Your health care provider may recommend certain vaccines, such as: Influenza vaccine. This is recommended every year. Tetanus, diphtheria, and acellular pertussis (Tdap, Td) vaccine. You may need a Td booster every  10 years. Zoster vaccine. You may need this after age  54. Pneumococcal 13-valent conjugate (PCV13) vaccine. One dose is recommended after age 93. Pneumococcal polysaccharide (PPSV23) vaccine. One dose is recommended after age 38. Talk to your health care provider about which screenings and vaccines you need and how often you need them. This information is not intended to replace advice given to you by your health care provider. Make sure you discuss any questions you have with your health care provider. Document Released: 03/14/2015 Document Revised: 11/05/2015 Document Reviewed: 12/17/2014 Elsevier Interactive Patient Education  2017 [employer] Prevention in the Home Falls can cause injuries. They can happen to people of all ages. There are many things you can do to make your home safe and to help prevent falls. What can I do on the outside of my home? Regularly fix the edges of walkways and driveways and fix any cracks. Remove anything that might make you trip as you walk through a door, such as a raised step or threshold. Trim any bushes or trees on the path to your home. Use bright outdoor lighting. Clear any walking paths of anything that might make someone trip, such as rocks or tools. Regularly check to see if handrails are loose or broken. Make sure that both sides of any steps have handrails. Any raised decks and porches should have guardrails on the edges. Have any leaves, snow, or ice cleared regularly. Use sand or salt on walking paths during winter. Clean up any spills in your garage right away. This includes oil or grease spills. What can I do in the bathroom? Use night lights. Install grab bars by the toilet and in the tub and shower. Do not use towel bars as grab bars. Use non-skid mats or decals in the tub or shower. If you need to sit down in the shower, use a plastic, non-slip stool. Keep the floor dry. Clean up any water that spills on the floor as soon as it happens. Remove soap buildup in the tub or shower  regularly. Attach bath mats securely with double-sided non-slip rug tape. Do not have throw rugs and other things on the floor that can make you trip. What can I do in the bedroom? Use night lights. Make sure that you have a light by your bed that is easy to reach. Do not use any sheets or blankets that are too big for your bed. They should not hang down onto the floor. Have a firm chair that has side arms. You can use this for support while you get dressed. Do not have throw rugs and other things on the floor that can make you trip. What can I do in the kitchen? Clean up any spills right away. Avoid walking on wet floors. Keep items that you use a lot in easy-to-reach places. If you need to reach something above you, use a strong step stool that has a grab bar. Keep electrical cords out of the way. Do not use floor polish or wax that makes floors slippery. If you must use wax, use non-skid floor wax. Do not have throw rugs and other things on the floor that can make you trip. What can I do with my stairs? Do not leave any items on the stairs. Make sure that there are handrails on both sides of the stairs and use them. Fix handrails that are broken or loose. Make sure that handrails are as long as the stairways. Check any carpeting to make  sure that it is firmly attached to the stairs. Fix any carpet that is loose or worn. Avoid having throw rugs at the top or bottom of the stairs. If you do have throw rugs, attach them to the floor with carpet tape. Make sure that you have a light switch at the top of the stairs and the bottom of the stairs. If you do not have them, ask someone to add them for you. What else can I do to help prevent falls? Wear shoes that: Do not have high heels. Have rubber bottoms. Are comfortable and fit you well. Are closed at the toe. Do not wear sandals. If you use a stepladder: Make sure that it is fully opened. Do not climb a closed stepladder. Make sure that  both sides of the stepladder are locked into place. Ask someone to hold it for you, if possible. Clearly mark and make sure that you can see: Any grab bars or handrails. First and last steps. Where the edge of each step is. Use tools that help you move around (mobility aids) if they are needed. These include: Canes. Walkers. Scooters. Crutches. Turn on the lights when you go into a dark area. Replace any light bulbs as soon as they burn out. Set up your furniture so you have a clear path. Avoid moving your furniture around. If any of your floors are uneven, fix them. If there are any pets around you, be aware of where they are. Review your medicines with your doctor. Some medicines can make you feel dizzy. This can increase your chance of falling. Ask your doctor what other things that you can do to help prevent falls. This information is not intended to replace advice given to you by your health care provider. Make sure you discuss any questions you have with your health care provider. Document Released: 12/12/2008 Document Revised: 07/24/2015 Document Reviewed: 03/22/2014 Elsevier Interactive Patient Education  2017 [employer].

## 2021-02-16 NOTE — Progress Notes (Signed)
Date:  02/16/2021   Name:  Dana Cameron   DOB:  03-Jun-1945   MRN:  [id number]   Chief Complaint: Cough  Cough This is a chronic problem. The current episode started in the past 7 days. The problem occurs hourly. The cough is Productive of sputum. Associated symptoms include headaches, nasal congestion and a sore throat. Pertinent negatives include no fever or shortness of breath. This is day 3.   Lab Results  Component Value Date   NA 144 07/04/2018   K 4.2 07/04/2018   CO2 24 01/30/2018   GLUCOSE 85 01/30/2018   BUN 15 07/04/2018   CREATININE 0.9 07/04/2018   CALCIUM 9.3 01/30/2018   GFRNONAA 60 01/30/2018   Lab Results  Component Value Date   CHOL 170 01/30/2018   HDL 58 01/30/2018   LDLCALC 84 01/30/2018   TRIG 140 01/30/2018   CHOLHDL 2.9 01/30/2018   Lab Results  Component Value Date   TSH 2.850 01/30/2018   No results found for: HGBA1C Lab Results  Component Value Date   WBC 4.2 07/04/2018   HGB 14.6 07/04/2018   HCT 43 07/04/2018   MCV 93 01/30/2018   PLT 268 07/04/2018   Lab Results  Component Value Date   ALT 12 01/30/2018   AST 15 01/30/2018   ALKPHOS 85 01/30/2018   BILITOT 0.5 01/30/2018   Lab Results  Component Value Date   VD25OH 52.4 07/04/2018     Review of Systems  Constitutional:  Negative for fever.  HENT:  Positive for sore throat.   Respiratory:  Positive for cough. Negative for shortness of breath.   Neurological:  Positive for headaches.   Patient Active Problem List   Diagnosis Date Noted   Special screening for malignant neoplasms, colon    History of nonmelanoma skin cancer 12/31/2019   Vitamin B 12 deficiency 08/01/2018   Varicose vein of leg 07/11/2018   Colon polyp, hyperplastic 01/28/2017   Arthritis of right hip 01/26/2016   Hyperlipidemia, mild 03/28/2015   Fibrocystic breast 09/17/2014   Environmental and seasonal allergies 09/17/2014   Pelvic prolapse 09/17/2014   Hot flash, menopausal 09/17/2014     Allergies  Allergen Reactions   Other     Neosporin ophthalmic caused red, burning eyes   Tape     bandaids cause red skin    Past Surgical History:  Procedure Laterality Date   BREAST BIOPSY Left 1993   neg   CATARACT EXTRACTION W/PHACO Left 08/12/2020   Procedure: CATARACT EXTRACTION PHACO AND INTRAOCULAR LENS PLACEMENT ([place]) LEFT;  Surgeon: Birder Robson, MD;  Location: [facility];  Service: Ophthalmology;  Laterality: Left;  6.89 00:41.2   CATARACT EXTRACTION W/PHACO Right 09/23/2020   Procedure: CATARACT EXTRACTION PHACO AND INTRAOCULAR LENS PLACEMENT ([facility]) RIGHT 3.01 00:22.6 ;  Surgeon: Birder Robson, MD;  Location: [facility];  Service: Ophthalmology;  Laterality: Right;   COLONOSCOPY  12/31/2003   one hyperplastic polyp   COLONOSCOPY WITH PROPOFOL N/A 01/07/2020   Procedure: COLONOSCOPY WITH PROPOFOL;  Surgeon: Lucilla Lame, MD;  Location: [facility];  Service: Endoscopy;  Laterality: N/A;  priority 4   EXCISION VAGINAL CYST     TOTAL KNEE ARTHROPLASTY Right 07/26/2017    Social History   Tobacco Use   Smoking status: Never   Smokeless tobacco: Never  Vaping Use   Vaping Use: Never used  Substance Use Topics   Alcohol use: No    Alcohol/week: 0.0 standard drinks  Drug use: No     Medication list has been reviewed and updated.  Current Meds  Medication Sig   albuterol (VENTOLIN HFA) 108 (90 Base) MCG/ACT inhaler Inhale 2 puffs into the lungs every 6 (six) hours as needed for wheezing or shortness of breath.   cetirizine (ZYRTEC) 10 MG tablet Take 10 mg by mouth daily.   Estradiol-Estriol-Progesterone (BIEST/PROGESTERONE) CREA APPLY 1 ML TOPICALLY EVERY DAY OR AS DIRECTED   metronidazole (NORITATE) 1 % cream Apply topically daily as needed.   Nutritional Supplements (JUICE PLUS FIBRE PO) Take by mouth. Takes: Juice Plus fruits, (2 caps AM), Juice Plus Veggies (2 caps PM), Juice Plus berries (1 cap AM & PM)   Omega 3 1000 MG  CAPS Take 1 capsule by mouth 2 (two) times daily. Juice plus brand with 5 omegas    PHQ 2/9 Scores 02/16/2021 01/07/2021 02/04/2020 08/23/2019  PHQ - 2 Score 0 0 0 0  PHQ- 9 Score - 0 - 0    GAD 7 : Generalized Anxiety Score 01/07/2021 08/23/2019  Nervous, Anxious, on Edge 0 0  Control/stop worrying 0 0  Worry too much - different things 0 0  Trouble relaxing 0 0  Restless 0 0  Easily annoyed or irritable 0 0  Afraid - awful might happen 0 0  Total GAD 7 Score 0 0  Anxiety Difficulty Not difficult at all Not difficult at all    BP Readings from Last 3 Encounters:  02/16/21 138/86  01/07/21 128/78  09/23/20 129/75    Physical Exam Constitutional:      Appearance: Normal appearance.  HENT:     Right Ear: Tympanic membrane and ear canal normal.     Left Ear: Tympanic membrane and ear canal normal.     Nose:     Right Sinus: No maxillary sinus tenderness or frontal sinus tenderness.     Left Sinus: No maxillary sinus tenderness or frontal sinus tenderness.     Mouth/Throat:     Pharynx: Posterior oropharyngeal erythema present. No pharyngeal swelling or oropharyngeal exudate.  Neck:     Vascular: No carotid bruit.  Cardiovascular:     Rate and Rhythm: Normal rate and regular rhythm.     Pulses: Normal pulses.     Heart sounds: No murmur heard. Pulmonary:     Effort: Pulmonary effort is normal. No respiratory distress.     Breath sounds: Normal breath sounds. Transmitted upper airway sounds present. No wheezing or rhonchi.  Musculoskeletal:        General: Normal range of motion.     Cervical back: Normal range of motion.  Lymphadenopathy:     Cervical: No cervical adenopathy.  Neurological:     Mental Status: She is alert.    Wt Readings from Last 3 Encounters:  02/16/21 171 lb 3.2 oz (77.7 kg)  01/07/21 167 lb 6.4 oz (75.9 kg)  09/23/20 170 lb (77.1 kg)    BP 138/86    Pulse 63    Temp 98.1 F (36.7 C) (Oral)    Ht 5\' 6"  (1.676 m)    Wt 171 lb 3.2 oz (77.7 kg)     SpO2 98%    BMI 27.63 kg/m   Assessment and Plan: 1. Acute non-recurrent maxillary sinusitis Mucinex-DM and Flonase - doxycycline (VIBRA-TABS) 100 MG tablet; Take 1 tablet (100 mg total) by mouth 2 (two) times daily for 10 days.  Dispense: 20 tablet; Refill: 0  2. URI with cough and congestion Use Mucinex-DM and  fluids - POC COVID-19 BinaxNow - negative   Partially dictated using Editor, commissioning. Any errors are unintentional.  Halina Maidens, MD [facility]  02/16/2021

## 2021-02-16 NOTE — Progress Notes (Signed)
Subjective:   Dana Cameron is a 75 y.o. female who presents for Medicare Annual (Subsequent) preventive examination.  Virtual Visit via Telephone Note  I connected with  Dana Cameron on 02/16/21 at  8:40 AM EST by telephone and verified that I am speaking with the correct person using two identifiers.  Location: Patient: home Provider: [facility] Persons participating in the virtual visit: Dana Cameron   I discussed the limitations, risks, security and privacy concerns of performing an evaluation and management service by telephone and the availability of in person appointments. The patient expressed understanding and agreed to proceed.  Interactive audio and video telecommunications were attempted between this nurse and patient, however failed, due to patient having technical difficulties OR patient did not have access to video capability.  We continued and completed visit with audio only.  Some vital signs may be absent or patient reported.   Dana Marker, LPN   Review of Systems     Cardiac Risk Factors include: advanced age (>35men, >40 women)     Objective:    Today's Vitals   02/16/21 0853  PainSc: 8    There is no height or weight on file to calculate BMI.  Advanced Directives 02/16/2021 09/23/2020 08/12/2020 01/07/2020 01/31/2019 01/11/2018 12/30/2016  Does Patient Have a Medical Advance Directive? Yes Yes Yes Yes Yes Yes Yes  Type of Paramedic of [place];Living will [place];Living will Living will;Healthcare Power of [place];Living will [place];Living will [place];Living will [place];Living will  Does patient want to make changes to medical advance directive? - No - Patient declined No - Patient declined No - Patient declined - - -  Copy of [place] in Chart? No - copy requested Yes -  validated most recent copy scanned in chart (See row information) No - copy requested No - copy requested No - copy requested No - copy requested No - copy requested    Current Medications (verified) Outpatient Encounter Medications as of 02/16/2021  Medication Sig   albuterol (VENTOLIN HFA) 108 (90 Base) MCG/ACT inhaler Inhale 2 puffs into the lungs every 6 (six) hours as needed for wheezing or shortness of breath.   cetirizine (ZYRTEC) 10 MG tablet Take 10 mg by mouth daily.   Estradiol-Estriol-Progesterone (BIEST/PROGESTERONE) CREA APPLY 1 ML TOPICALLY EVERY DAY OR AS DIRECTED   metronidazole (NORITATE) 1 % cream Apply topically daily as needed.   Nutritional Supplements (JUICE PLUS FIBRE PO) Take by mouth. Takes: Juice Plus fruits, (2 caps AM), Juice Plus Veggies (2 caps PM), Juice Plus berries (1 cap AM & PM)   Omega 3 1000 MG CAPS Take 1 capsule by mouth 2 (two) times daily. Juice plus brand with 5 omegas   No facility-administered encounter medications on file as of 02/16/2021.    Allergies (verified) Other and Tape   History: Past Medical History:  Diagnosis Date   Allergy    Fall Seasonal Allergies   Arthritis of right hip    Cataract    Hyperlipidemia    PONV (postoperative nausea and vomiting)    Vaginal prolapse    Varicose veins of legs    has been treated   Past Surgical History:  Procedure Laterality Date   BREAST BIOPSY Left 1993   neg   CATARACT EXTRACTION W/PHACO Left 08/12/2020   Procedure: CATARACT EXTRACTION PHACO AND INTRAOCULAR LENS PLACEMENT (IOC) LEFT;  Surgeon: George Ina,  Gwyndolyn Saxon, MD;  Location: [facility];  Service: Ophthalmology;  Laterality: Left;  6.89 00:41.2   CATARACT EXTRACTION W/PHACO Right 09/23/2020   Procedure: CATARACT EXTRACTION PHACO AND INTRAOCULAR LENS PLACEMENT (IOC) RIGHT 3.01 00:22.6 ;  Surgeon: Birder Robson, MD;  Location: [place];  Service: Ophthalmology;  Laterality: Right;   COLONOSCOPY  12/31/2003    one hyperplastic polyp   COLONOSCOPY WITH PROPOFOL N/A 01/07/2020   Procedure: COLONOSCOPY WITH PROPOFOL;  Surgeon: Lucilla Lame, MD;  Location: [place];  Service: Endoscopy;  Laterality: N/A;  priority 4   EXCISION VAGINAL CYST     TOTAL KNEE ARTHROPLASTY Right 07/26/2017   Family History  Problem Relation Age of Onset   Transient ischemic attack Mother    Stroke Mother    Hypertension Father    Stroke Father    Asthma Sister    Lung cancer Brother    Cancer Brother    Varicose Veins Daughter    Breast cancer Neg Hx    Social History   Socioeconomic History   Marital status: Married    Spouse name: Not on file   Number of children: 2   Years of education: 12   Highest education level: High school graduate  Occupational History   Occupation: retired  Tobacco Use   Smoking status: Never   Smokeless tobacco: Never  Vaping Use   Vaping Use: Never used  Substance and Sexual Activity   Alcohol use: No    Alcohol/week: 0.0 standard drinks   Drug use: No   Sexual activity: Not Currently    Birth control/protection: None  Other Topics Concern   Not on file  Social History Narrative   Pt lives in [place] January, February and March each year, plays golf there   Social Determinants of Radio broadcast assistant Strain: Low Risk    Difficulty of Paying Living Expenses: Not hard at all  Food Insecurity: No Food Insecurity   Worried About Charity fundraiser in the Last Year: Never true   Arboriculturist in the Last Year: Never true  Transportation Needs: No Transportation Needs   Lack of Transportation (Medical): No   Lack of Transportation (Non-Medical): No  Physical Activity: Inactive   Days of Exercise per Week: 0 days   Minutes of Exercise per Session: 0 min  Stress: No Stress Concern Present   Feeling of Stress : Not at all  Social Connections: Moderately Integrated   Frequency of Communication with Friends and Family: More than three times a week    Frequency of Social Gatherings with Friends and Family: Three times a week   Attends Religious Services: More than 4 times per year   Active Member of Clubs or Organizations: No   Attends Archivist Meetings: Never   Marital Status: Married    Tobacco Counseling Counseling given: Not Answered   Clinical Intake:  Pre-visit preparation completed: Yes  Pain : 0-10 Pain Score: 8  Pain Type: Acute pain Pain Location: Throat Pain Descriptors / Indicators: Sore Pain Onset: In the past 7 days Pain Frequency: Constant     Nutritional Risks: Nausea/ vomitting/ diarrhea (nausea/dry heaves) Diabetes: No  How often do you need to have someone help you when you read instructions, pamphlets, or other written materials from your doctor or pharmacy?: 1 - Never    Interpreter Needed?: No  Information entered by :: Dana Marker LPN   Activities of Daily Living In your present state of  health, do you have any difficulty performing the following activities: 02/16/2021 01/07/2021  Hearing? Y N  Vision? N N  Difficulty concentrating or making decisions? N N  Walking or climbing stairs? N N  Dressing or bathing? N N  Doing errands, shopping? N N  Preparing Food and eating ? N -  Using the Toilet? N -  In the past six months, have you accidently leaked urine? N -  Do you have problems with loss of bowel control? N -  Managing your Medications? N -  Managing your Finances? N -  Housekeeping or managing your Housekeeping? N -  Some recent data might be hidden    Patient Care Team: Glean Hess, MD as PCP - General (Internal Medicine) Kirk Ruths, MD (Gastroenterology) Dr. Tyler Deis (Vascular Surgery) Ree Edman, MD (Dermatology)  Indicate any recent Medical Services you may have received from other than [facility] providers in the past year (date may be approximate).     Assessment:   This is a routine wellness examination for [place].  Hearing/Vision  screen Hearing Screening - Comments:: Pt sees Dr. Charolett Bumpers for hearing evaluations; does qualify for hearing aids now but putting off for now Vision Screening - Comments:: [facility] screen[facility] Dr. George Ina  Dietary issues and exercise activities discussed: Current Exercise Habits: The patient does not participate in regular exercise at present, Exercise limited by: None identified   Goals Addressed             This Visit's Progress    DIET - INCREASE WATER INTAKE       Recommend drinking 6-8 glasses of water per day        Depression Screen PHQ 2/9 Scores 02/16/2021 01/07/2021 02/04/2020 08/23/2019 01/31/2019 01/19/2019 08/01/2018  PHQ - 2 Score 0 0 0 0 0 0 0  PHQ- 9 Score - 0 - 0 - - -    Fall Risk Fall Risk  02/16/2021 01/07/2021 02/04/2020 08/23/2019 01/31/2019  Falls in the past year? 1 1 0 0 0  Number falls in past yr: 0 1 0 - 0  Injury with Fall? 0 0 0 - 0  Risk for fall due to : No Fall Risks History of fall(s) No Fall Risks No Fall Risks -  Follow up Falls prevention discussed Falls evaluation completed Falls prevention discussed Falls evaluation completed Falls prevention discussed    FALL RISK PREVENTION PERTAINING TO THE HOME:  Any stairs in or around the home? Yes  If so, are there any without handrails? No  Home free of loose throw rugs in walkways, pet beds, electrical cords, etc? Yes  Adequate lighting in your home to reduce risk of falls? Yes   ASSISTIVE DEVICES UTILIZED TO PREVENT FALLS:  Life alert? No  Use of a cane, walker or w/c? No  Grab bars in the bathroom? No  Shower chair or bench in shower? No  Elevated toilet seat or a handicapped toilet? Yes   TIMED UP AND GO:  Was the test performed? No . Telephonic visit  Cognitive Function: Normal cognitive status assessed by direct observation by this Nurse Health Advisor. No abnormalities found.       6CIT Screen 01/31/2019 01/11/2018 12/30/2016 01/26/2016  What Year? 0 points 0  points 0 points 0 points  What month? 0 points 0 points 0 points 0 points  What time? 0 points 0 points 0 points 0 points  Count back from 20 0 points 0 points 0 points  0 points  Months in reverse 0 points 0 points 0 points 0 points  Repeat phrase 0 points 0 points 0 points 0 points  Total Score 0 0 0 0    Immunizations Immunization History  Administered Date(s) Administered   Fluad Quad(high Dose 65+) 12/26/2018   Influenza, High Dose Seasonal PF 12/30/2016, 02/07/2018, 01/28/2021   Influenza, Seasonal, Injecte, Preservative Fre 12/15/2011   Influenza,inj,Quad PF,6+ Mos 12/15/2012, 12/18/2013, 11/21/2014, 01/26/2016   Influenza-Unspecified 02/07/2018, 12/10/2019   PFIZER Comirnaty(Gray Top)Covid-19 Tri-Sucrose Vaccine 08/20/2020   PFIZER(Purple Top)SARS-COV-2 Vaccination 06/21/2019, 07/24/2019, 02/05/2020, 08/20/2020   Pneumococcal Conjugate-13 01/21/2015   Pneumococcal Polysaccharide-23 08/24/2012   Tdap 08/25/2009   Zoster, Live 06/22/2010    TDAP status: Due, Education has been provided regarding the importance of this vaccine. Advised may receive this vaccine at local pharmacy or Health Dept. Aware to provide a copy of the vaccination record if obtained from local pharmacy or Health Dept. Verbalized acceptance and understanding.  Flu Vaccine status: Up to date  Pneumococcal vaccine status: Completed during today's visit.  Covid-19 vaccine status: Completed vaccines  Qualifies for Shingles Vaccine? Yes   Zostavax completed Yes   Shingrix Completed?: No.    Education has been provided regarding the importance of this vaccine. Patient has been advised to call insurance company to determine out of pocket expense if they have not yet received this vaccine. Advised may also receive vaccine at local pharmacy or Health Dept. Verbalized acceptance and understanding.  Screening Tests Health Maintenance  Topic Date Due   Zoster Vaccines- Shingrix (1 of 2) Never done   COVID-19  Vaccine (6 - Booster for Pfizer series) 10/15/2020   TETANUS/TDAP  03/01/2021 (Originally 08/26/2019)   MAMMOGRAM  06/12/2021   COLONOSCOPY (Pts 45-54yrs Insurance coverage will need to be confirmed)  01/06/2030   Pneumonia Vaccine 32+ Years old  Completed   INFLUENZA VACCINE  Completed   DEXA SCAN  Completed   Hepatitis C Screening  Addressed   HPV VACCINES  Aged Out    Health Maintenance  Health Maintenance Due  Topic Date Due   Zoster Vaccines- Shingrix (1 of 2) Never done   COVID-19 Vaccine (6 - Booster for Pfizer series) 10/15/2020    Colorectal cancer screening: Type of screening: Colonoscopy. Completed 01/07/20. Repeat every 10 years  Mammogram status: Completed 06/12/20. Repeat every year  Bone Density status: Completed 02/20/19. Results reflect: Bone density results: OSTEOPENIA. Repeat every 2 years. Pt declines repeat screening at this time.   Lung Cancer Screening: (Low Dose CT Chest recommended if Age 77-80 years, 30 pack-year currently smoking OR have quit w/in 15years.) does not qualify.   Additional Screening:  Hepatitis C Screening: does qualify; Completed 11/29/14  Vision Screening: Recommended annual ophthalmology exams for early detection of glaucoma and other disorders of the eye. Is the patient up to date with their annual eye exam?  Yes  Who is the provider or what is the name of the office in which the patient attends annual eye exams? [facility].   Dental Screening: Recommended annual dental exams for proper oral hygiene  Community Resource Referral / Chronic Care Management: CRR required this visit?  No   CCM required this visit?  No      Plan:     I have personally reviewed and noted the following in the patients chart:   Medical and social history Use of alcohol, tobacco or illicit drugs  Current medications and supplements including opioid prescriptions.  Functional ability and status  Nutritional status Physical  activity Advanced directives List of other physicians Hospitalizations, surgeries, and ER visits in previous 12 months Vitals Screenings to include cognitive, depression, and falls Referrals and appointments  In addition, I have reviewed and discussed with patient certain preventive protocols, quality metrics, and best practice recommendations. A written personalized care plan for preventive services as well as general preventive health recommendations were provided to patient.     Dana Marker, LPN   [id number]   Nurse Notes: pt c/o cough/congestion with dark yellow phlegm and sore throat starting Friday 02/13/21; pt taking mucinex and tylenol. Message sent to Dr. Army Melia to request same day visit for possible sinus infection. Pt denies fever or known exposure to covid or flu.

## 2021-02-16 NOTE — Patient Instructions (Addendum)
Mucinex  -DM  take as directed on the bottle  Fluids -   Flonase Nasal spray

## 2021-02-18 DIAGNOSIS — M9902 Segmental and somatic dysfunction of thoracic region: Secondary | ICD-10-CM | POA: Diagnosis not present

## 2021-02-18 DIAGNOSIS — M5033 Other cervical disc degeneration, cervicothoracic region: Secondary | ICD-10-CM | POA: Diagnosis not present

## 2021-02-18 DIAGNOSIS — M9901 Segmental and somatic dysfunction of cervical region: Secondary | ICD-10-CM | POA: Diagnosis not present

## 2021-02-18 DIAGNOSIS — M62830 Muscle spasm of back: Secondary | ICD-10-CM | POA: Diagnosis not present

## 2021-03-19 DIAGNOSIS — J069 Acute upper respiratory infection, unspecified: Secondary | ICD-10-CM | POA: Diagnosis not present

## 2021-04-20 DIAGNOSIS — N390 Urinary tract infection, site not specified: Secondary | ICD-10-CM | POA: Diagnosis not present

## 2021-06-10 DIAGNOSIS — M62830 Muscle spasm of back: Secondary | ICD-10-CM | POA: Diagnosis not present

## 2021-06-10 DIAGNOSIS — M9901 Segmental and somatic dysfunction of cervical region: Secondary | ICD-10-CM | POA: Diagnosis not present

## 2021-06-10 DIAGNOSIS — M9902 Segmental and somatic dysfunction of thoracic region: Secondary | ICD-10-CM | POA: Diagnosis not present

## 2021-06-10 DIAGNOSIS — M5033 Other cervical disc degeneration, cervicothoracic region: Secondary | ICD-10-CM | POA: Diagnosis not present

## 2021-07-01 DIAGNOSIS — H35372 Puckering of macula, left eye: Secondary | ICD-10-CM | POA: Diagnosis not present

## 2021-07-08 DIAGNOSIS — M62830 Muscle spasm of back: Secondary | ICD-10-CM | POA: Diagnosis not present

## 2021-07-08 DIAGNOSIS — M9902 Segmental and somatic dysfunction of thoracic region: Secondary | ICD-10-CM | POA: Diagnosis not present

## 2021-07-08 DIAGNOSIS — M9901 Segmental and somatic dysfunction of cervical region: Secondary | ICD-10-CM | POA: Diagnosis not present

## 2021-07-08 DIAGNOSIS — M5033 Other cervical disc degeneration, cervicothoracic region: Secondary | ICD-10-CM | POA: Diagnosis not present

## 2021-07-13 ENCOUNTER — Other Ambulatory Visit: Payer: Self-pay | Admitting: Internal Medicine

## 2021-07-13 DIAGNOSIS — Z1231 Encounter for screening mammogram for malignant neoplasm of breast: Secondary | ICD-10-CM

## 2021-07-20 ENCOUNTER — Inpatient Hospital Stay: Admission: RE | Admit: 2021-07-20 | Payer: Medicare HMO | Source: Ambulatory Visit

## 2021-08-12 DIAGNOSIS — M9902 Segmental and somatic dysfunction of thoracic region: Secondary | ICD-10-CM | POA: Diagnosis not present

## 2021-08-12 DIAGNOSIS — M9901 Segmental and somatic dysfunction of cervical region: Secondary | ICD-10-CM | POA: Diagnosis not present

## 2021-08-12 DIAGNOSIS — M5033 Other cervical disc degeneration, cervicothoracic region: Secondary | ICD-10-CM | POA: Diagnosis not present

## 2021-08-12 DIAGNOSIS — M62830 Muscle spasm of back: Secondary | ICD-10-CM | POA: Diagnosis not present

## 2021-08-24 ENCOUNTER — Ambulatory Visit
Admission: RE | Admit: 2021-08-24 | Discharge: 2021-08-24 | Disposition: A | Discharge status: Home / Self Care | Payer: Medicare HMO | Source: Ambulatory Visit | Attending: Internal Medicine | Admitting: Internal Medicine

## 2021-08-24 DIAGNOSIS — Z1231 Encounter for screening mammogram for malignant neoplasm of breast: Secondary | ICD-10-CM | POA: Diagnosis not present

## 2021-09-09 DIAGNOSIS — M9901 Segmental and somatic dysfunction of cervical region: Secondary | ICD-10-CM | POA: Diagnosis not present

## 2021-09-09 DIAGNOSIS — M9902 Segmental and somatic dysfunction of thoracic region: Secondary | ICD-10-CM | POA: Diagnosis not present

## 2021-09-09 DIAGNOSIS — M5033 Other cervical disc degeneration, cervicothoracic region: Secondary | ICD-10-CM | POA: Diagnosis not present

## 2021-09-09 DIAGNOSIS — M62830 Muscle spasm of back: Secondary | ICD-10-CM | POA: Diagnosis not present

## 2021-09-22 ENCOUNTER — Telehealth: Payer: Self-pay | Admitting: Internal Medicine

## 2021-09-22 ENCOUNTER — Other Ambulatory Visit: Payer: Self-pay | Admitting: Internal Medicine

## 2021-09-22 ENCOUNTER — Encounter: Payer: Self-pay | Admitting: Internal Medicine

## 2021-09-22 NOTE — Telephone Encounter (Signed)
Unable to link medication. Pharmacy is requesting refill. Will route to provider d/t being a compound medication.   Order Details  Medication NDC Prescription # DAW  PROGESTERONE 3.5% HRT Not Available [id number] No  Written Date Last Fill Date Quantity Days Supply  09/11/2020 06/12/2021 60 each 30  Sig Notes  APPLY 1ML TO 2ML (1-2 PUMPS) TOPICALLY DAILY AS DIRECTED Not Available

## 2021-09-23 NOTE — Telephone Encounter (Addendum)
Patient spoke with pharmacy today and advised her to call PCP office and request  Progesterone 3.5% HRT. Pharmacy states they have not received prescription. Patient would like a call back regarding her request, please call mobile #     Dana Cameron, [place] Phone:  [phone]  Fax:  [phone]

## 2021-10-07 DIAGNOSIS — M5033 Other cervical disc degeneration, cervicothoracic region: Secondary | ICD-10-CM | POA: Diagnosis not present

## 2021-10-07 DIAGNOSIS — M9901 Segmental and somatic dysfunction of cervical region: Secondary | ICD-10-CM | POA: Diagnosis not present

## 2021-10-07 DIAGNOSIS — M9902 Segmental and somatic dysfunction of thoracic region: Secondary | ICD-10-CM | POA: Diagnosis not present

## 2021-10-07 DIAGNOSIS — M62830 Muscle spasm of back: Secondary | ICD-10-CM | POA: Diagnosis not present

## 2021-11-04 DIAGNOSIS — M9902 Segmental and somatic dysfunction of thoracic region: Secondary | ICD-10-CM | POA: Diagnosis not present

## 2021-11-04 DIAGNOSIS — M9901 Segmental and somatic dysfunction of cervical region: Secondary | ICD-10-CM | POA: Diagnosis not present

## 2021-11-04 DIAGNOSIS — M62830 Muscle spasm of back: Secondary | ICD-10-CM | POA: Diagnosis not present

## 2021-11-04 DIAGNOSIS — M5033 Other cervical disc degeneration, cervicothoracic region: Secondary | ICD-10-CM | POA: Diagnosis not present

## 2021-12-02 DIAGNOSIS — M9901 Segmental and somatic dysfunction of cervical region: Secondary | ICD-10-CM | POA: Diagnosis not present

## 2021-12-02 DIAGNOSIS — M9902 Segmental and somatic dysfunction of thoracic region: Secondary | ICD-10-CM | POA: Diagnosis not present

## 2021-12-02 DIAGNOSIS — M5033 Other cervical disc degeneration, cervicothoracic region: Secondary | ICD-10-CM | POA: Diagnosis not present

## 2021-12-02 DIAGNOSIS — M62830 Muscle spasm of back: Secondary | ICD-10-CM | POA: Diagnosis not present

## 2021-12-15 DIAGNOSIS — I83893 Varicose veins of bilateral lower extremities with other complications: Secondary | ICD-10-CM | POA: Diagnosis not present

## 2022-01-06 DIAGNOSIS — M62830 Muscle spasm of back: Secondary | ICD-10-CM | POA: Diagnosis not present

## 2022-01-06 DIAGNOSIS — M5033 Other cervical disc degeneration, cervicothoracic region: Secondary | ICD-10-CM | POA: Diagnosis not present

## 2022-01-06 DIAGNOSIS — M9902 Segmental and somatic dysfunction of thoracic region: Secondary | ICD-10-CM | POA: Diagnosis not present

## 2022-01-06 DIAGNOSIS — M9901 Segmental and somatic dysfunction of cervical region: Secondary | ICD-10-CM | POA: Diagnosis not present

## 2022-01-11 ENCOUNTER — Telehealth: Payer: Self-pay | Admitting: Internal Medicine

## 2022-01-11 NOTE — Telephone Encounter (Signed)
Patient called in needs authorization for Estradiol-Estriol-Progesterone (BIEST/PROGESTERONE) CREA  before she can get   refilled

## 2022-01-13 ENCOUNTER — Other Ambulatory Visit: Payer: Self-pay | Admitting: Internal Medicine

## 2022-01-15 NOTE — Telephone Encounter (Signed)
Called pt left VM to call back. Pt will have to pay out of pocket. Pt is over the age of 30 insurance wont cover the medication.  KP

## 2022-01-18 NOTE — Telephone Encounter (Signed)
Called, left VM to call back in regards to medication refill.

## 2022-01-19 DIAGNOSIS — M62830 Muscle spasm of back: Secondary | ICD-10-CM | POA: Diagnosis not present

## 2022-01-19 DIAGNOSIS — M9901 Segmental and somatic dysfunction of cervical region: Secondary | ICD-10-CM | POA: Diagnosis not present

## 2022-01-19 DIAGNOSIS — M5033 Other cervical disc degeneration, cervicothoracic region: Secondary | ICD-10-CM | POA: Diagnosis not present

## 2022-01-19 DIAGNOSIS — M9902 Segmental and somatic dysfunction of thoracic region: Secondary | ICD-10-CM | POA: Diagnosis not present

## 2022-01-22 NOTE — Telephone Encounter (Signed)
Pt is fine paying out of pocket. She states she always does. Please advise

## 2022-01-25 NOTE — Telephone Encounter (Signed)
Noted for chart.

## 2022-01-29 DIAGNOSIS — M7989 Other specified soft tissue disorders: Secondary | ICD-10-CM | POA: Diagnosis not present

## 2022-01-29 DIAGNOSIS — I83812 Varicose veins of left lower extremities with pain: Secondary | ICD-10-CM | POA: Diagnosis not present

## 2022-02-02 ENCOUNTER — Other Ambulatory Visit: Payer: Self-pay | Admitting: Internal Medicine

## 2022-02-02 NOTE — Telephone Encounter (Signed)
Patient informed. 

## 2022-02-02 NOTE — Telephone Encounter (Signed)
Pt following up on this medication.  This is a request for a refill of her Estradiol-Estriol-Progesterone (BIEST/PROGESTERONE) [place], [place]   (Not an FYI) Pt states she has the progesterone, but not the estradiol.   Pt would like you to send this Rx ASAP.    Pt states she has been out for 3 weeks and she can really tell the difference.

## 2022-02-03 DIAGNOSIS — M62830 Muscle spasm of back: Secondary | ICD-10-CM | POA: Diagnosis not present

## 2022-02-03 DIAGNOSIS — M5033 Other cervical disc degeneration, cervicothoracic region: Secondary | ICD-10-CM | POA: Diagnosis not present

## 2022-02-03 DIAGNOSIS — M9901 Segmental and somatic dysfunction of cervical region: Secondary | ICD-10-CM | POA: Diagnosis not present

## 2022-02-03 DIAGNOSIS — M9902 Segmental and somatic dysfunction of thoracic region: Secondary | ICD-10-CM | POA: Diagnosis not present

## 2022-02-05 ENCOUNTER — Telehealth: Payer: Self-pay | Admitting: Internal Medicine

## 2022-02-05 DIAGNOSIS — M7989 Other specified soft tissue disorders: Secondary | ICD-10-CM | POA: Diagnosis not present

## 2022-02-05 DIAGNOSIS — I83811 Varicose veins of right lower extremities with pain: Secondary | ICD-10-CM | POA: Diagnosis not present

## 2022-02-05 NOTE — Telephone Encounter (Signed)
Unable to enter as refill request- Compounded medication:  Estradiol-Estriol-Progesterone (BIEST/PROGESTERONE) CREA  Dispense: 30 g Admin Instructions: APPLY 1 ML TOPICALLY EVERY DAY OR AS DIRECTED [name]  Request directed to office for provider review

## 2022-02-05 NOTE — Telephone Encounter (Signed)
Spoke to pt told her that we did not receive a fax yet. Pt stated she would call the pharmacy and have them to fax it again.  KP

## 2022-02-05 NOTE — Telephone Encounter (Unsigned)
Copied from [place] [phone]. Topic: General - Other >> Feb 05, 2022  2:12 PM Everette C wrote: Reason for CRM: Medication Refill - Medication: Estradiol-Estriol-Progesterone (BIEST/PROGESTERONE) CREA [[id number]]  Has the patient contacted their pharmacy? Yes.   (Agent: If no, request that the patient contact the pharmacy for the refill. If patient does not wish to contact the pharmacy document the reason why and proceed with request.) (Agent: If yes, when and what did the pharmacy advise?)  Preferred Pharmacy (with phone number or street name): [facility] [place] [address] [place] [address] Phone: [phone] Fax: [phone] Hours: Not open 24 hours   Has the patient been seen for an appointment in the last year OR does the patient have an upcoming appointment? Yes.    Agent: Please be advised that RX refills may take up to 3 business days. We ask that you follow-up with your pharmacy.

## 2022-02-09 NOTE — Telephone Encounter (Signed)
Patient called back again about Estradiol-Estriol-Progesterone (BIEST/PROGESTERONE) CREA  Dispense: 30 g. Please call back with further status

## 2022-02-09 NOTE — Telephone Encounter (Signed)
Sent pt mychart message informing we still have not received Rx.   Dana Cameron

## 2022-02-12 DIAGNOSIS — M79644 Pain in right finger(s): Secondary | ICD-10-CM | POA: Diagnosis not present

## 2022-02-12 DIAGNOSIS — M65311 Trigger thumb, right thumb: Secondary | ICD-10-CM | POA: Diagnosis not present

## 2022-02-17 ENCOUNTER — Ambulatory Visit (INDEPENDENT_AMBULATORY_CARE_PROVIDER_SITE_OTHER): Payer: Medicare HMO

## 2022-02-17 DIAGNOSIS — Z0000 Encounter for general adult medical examination without abnormal findings: Secondary | ICD-10-CM | POA: Diagnosis not present

## 2022-02-17 NOTE — Progress Notes (Signed)
I connected with  [name] on 02/17/22 by a audio enabled telemedicine application and verified that I am speaking with the correct person using two identifiers.  Patient Location: Home  Provider Location: Office/Clinic  I discussed the limitations of evaluation and management by telemedicine. The patient expressed understanding and agreed to proceed.  Subjective:   Dana Cameron is a 76 y.o. female who presents for Medicare Annual (Subsequent) preventive examination.  Review of Systems    Per HPI unless specifically indicated below.  Cardiac Risk Factors include: advanced age (>62mn, >>66women);female gender and hyperlipidemia.           Objective:       02/16/2021    4:19 PM 01/07/2021    8:50 AM 09/23/2020   12:41 PM  Vitals with BMI  Height '5\' 6"'$  '5\' 6"'$    Weight 171 lbs 3 oz 167 lbs 6 oz   BMI 206.30216.01  Systolic [id number]  Diastolic 86 78   Pulse 63 68 52    There were no vitals filed for this visit. There is no height or weight on file to calculate BMI.     02/16/2021    8:56 AM 09/23/2020   11:07 AM 08/12/2020   11:44 AM 01/07/2020    7:27 AM 01/31/2019    8:52 AM 01/11/2018   10:53 AM 12/30/2016   10:48 AM  Advanced Directives  Does Patient Have a Medical Advance Directive? Yes Yes Yes Yes Yes Yes Yes  Type of AParamedicof [place]ut[place]Living will [place]indisfar[place]Living will Living will;Healthcare Power of APar[place]Living will HA[place]Living will [place]sterLiving will HPantegoLiving will  Does patient want to make changes to medical advance directive?  No - Patient declined No - Patient declined No - Patient declined     Copy of HJohnstonin Chart? No - copy requested Yes - validated most recent copy scanned in chart (See row information) No - copy requested No - copy requested No - copy requested No -  copy requested No - copy requested    Current Medications (verified) Outpatient Encounter Medications as of 02/17/2022  Medication Sig   albuterol (VENTOLIN HFA) 108 (90 Base) MCG/ACT inhaler Inhale 2 puffs into the lungs every 6 (six) hours as needed for wheezing or shortness of breath.   cetirizine (ZYRTEC) 10 MG tablet Take 10 mg by mouth daily.   Estradiol-Estriol-Progesterone (BIEST/PROGESTERONE) CREA APPLY 1 ML TOPICALLY EVERY DAY OR AS DIRECTED   metronidazole (NORITATE) 1 % cream Apply topically daily as needed.   Nutritional Supplements (JUICE PLUS FIBRE PO) Take by mouth. Takes: Juice Plus fruits, (2 caps AM), Juice Plus Veggies (2 caps PM), Juice Plus berries (1 cap AM & PM)   Omega 3 1000 MG CAPS Take 1 capsule by mouth 2 (two) times daily. Juice plus brand with 5 omegas   No facility-administered encounter medications on file as of 02/17/2022.    Allergies (verified) Other and Tape   History: Past Medical History:  Diagnosis Date   Allergy    Fall Seasonal Allergies   Arthritis of right hip    Cataract    Hyperlipidemia    PONV (postoperative nausea and vomiting)    Vaginal prolapse    Varicose veins of legs    has been treated   Past Surgical History:  Procedure Laterality Date   BREAST BIOPSY Left  1993   neg   CATARACT EXTRACTION W/PHACO Left 08/12/2020   Procedure: CATARACT EXTRACTION PHACO AND INTRAOCULAR LENS PLACEMENT ([place]) LEFT;  Surgeon: Birder Robson, MD;  Location: [place];  Service: Ophthalmology;  Laterality: Left;  6.89 00:41.2   CATARACT EXTRACTION W/PHACO Right 09/23/2020   Procedure: CATARACT EXTRACTION PHACO AND INTRAOCULAR LENS PLACEMENT ([facility]) RIGHT 3.01 00:22.6 ;  Surgeon: Birder Robson, MD;  Location: [place];  Service: Ophthalmology;  Laterality: Right;   COLONOSCOPY  12/31/2003   one hyperplastic polyp   COLONOSCOPY WITH PROPOFOL N/A 01/07/2020   Procedure: COLONOSCOPY WITH PROPOFOL;  Surgeon: Lucilla Lame,  MD;  Location: [place];  Service: Endoscopy;  Laterality: N/A;  priority 4   EXCISION VAGINAL CYST     TOTAL KNEE ARTHROPLASTY Right 07/26/2017   Family History  Problem Relation Age of Onset   Transient ischemic attack Mother    Stroke Mother    Hypertension Father    Stroke Father    Asthma Sister    Lung cancer Brother    Cancer Brother    Varicose Veins Daughter    Breast cancer Neg Hx    Social History   Socioeconomic History   Marital status: Married    Spouse name: Jilene Spohr   Number of children: 2   Years of education: 12   Highest education level: High school graduate  Occupational History   Occupation: retired  Tobacco Use   Smoking status: Never   Smokeless tobacco: Never  Vaping Use   Vaping Use: Never used  Substance and Sexual Activity   Alcohol use: No    Alcohol/week: 0.0 standard drinks of alcohol   Drug use: No   Sexual activity: Not Currently    Birth control/protection: None  Other Topics Concern   Not on file  Social History Narrative   Pt lives in [place] January, February and March each year, plays golf there   Social Determinants of Health   Financial Resource Strain: Low Risk  (02/17/2022)   Overall Financial Resource Strain (CARDIA)    Difficulty of Paying Living Expenses: Not hard at all  Food Insecurity: No Food Insecurity (02/17/2022)   Hunger Vital Sign    Worried About Running Out of Food in the Last Year: Never true    [place] in the Last Year: Never true  Transportation Needs: No Transportation Needs (02/17/2022)   PRAPARE - Hydrologist (Medical): No    Lack of Transportation (Non-Medical): No  Physical Activity: Inactive (02/17/2022)   Exercise Vital Sign    Days of Exercise per Week: 0 days    Minutes of Exercise per Session: 0 min  Stress: No Stress Concern Present (02/17/2022)   [place]    Feeling  of Stress : Not at all  Social Connections: [place] (02/17/2022)   Social Connection and Isolation Panel [NHANES]    Frequency of Communication with Friends and Family: More than three times a week    Frequency of Social Gatherings with Friends and Family: More than three times a week    Attends Religious Services: More than 4 times per year    Active Member of [employer] or Organizations: Yes    Attends Music therapist: More than 4 times per year    Marital Status: Married    Tobacco Counseling Counseling given: Not Answered   Clinical Intake:  Pre-visit preparation completed: No  Pain : No/denies pain     Nutritional Status: BMI 25 -29 Overweight Nutritional Risks: None Diabetes: No  How often do you need to have someone help you when you read instructions, pamphlets, or other written materials from your doctor or pharmacy?: 1 - Never  Diabetic?No       Information entered by :: Celedonio Savage   Activities of Daily Living    02/17/2022    8:38 AM  In your present state of health, do you have any difficulty performing the following activities:  Hearing? 1  Vision? [place]  Difficulty concentrating or making decisions? 0  Walking or climbing stairs? 0  Dressing or bathing? 0  Doing errands, shopping? 0    Patient Care Team: Glean Hess, MD as PCP - General (Internal Medicine) Kirk Ruths, MD (Gastroenterology) Dr. Tyler Deis (Vascular Surgery) Ree Edman, MD (Dermatology)  Indicate any recent Medical Services you may have received from other than [facility] providers in the past year (date may be approximate). The patient was seen at [facility] on 02/12/22 for Rt thumb pain.     Assessment:   This is a routine wellness examination for [place].  Hearing/Vision screen Bilateral hearing issues. Declined hearing aids at this time, because of the cost.  Denies any changes in her vision. Wear  glasses, Annual [place].  Dietary issues and exercise activities discussed: Current Exercise Habits: The patient has a physically strenuous job, but has no regular exercise apart from work., Exercise limited by: None identified   Goals Addressed   None    Depression Screen    02/17/2022    8:37 AM 02/16/2021    8:56 AM 01/07/2021    8:53 AM 02/04/2020    8:58 AM 08/23/2019    1:49 PM 01/31/2019    8:54 AM 01/19/2019    3:12 PM  PHQ 2/9 Scores  PHQ - 2 Score 0 0 0 0 0 0 0  PHQ- 9 Score   0  0      Fall Risk    02/17/2022    8:38 AM 02/16/2021    8:57 AM 01/07/2021    8:54 AM 02/04/2020    9:00 AM 08/23/2019    1:49 PM  Fall Risk   Falls in the past year? 0 1 1 0 0  Number falls in past yr: 0 0 1 0   Injury with Fall? 0 0 0 0   Risk for fall due to : No Fall Risks No Fall Risks History of fall(s) No Fall Risks No Fall Risks  Follow up Falls evaluation completed Falls prevention discussed Falls evaluation completed Falls prevention discussed Falls evaluation completed    FALL RISK PREVENTION PERTAINING TO THE HOME:  Any stairs in or around the home? Yes  If so, are there any without handrails? Yes  Home free of loose throw rugs in walkways, pet beds, electrical cords, etc? Yes  Adequate lighting in your home to reduce risk of falls? Yes   ASSISTIVE DEVICES UTILIZED TO PREVENT FALLS:  Life alert? No  Use of a cane, walker or w/c? No  Grab bars in the bathroom? Yes  Shower chair or bench in shower? No  Elevated toilet seat or a handicapped toilet? Yes   TIMED UP AND GO:  Was the test performed? No . Virtual appt.  Cognitive Function:        02/17/2022    8:39 AM 01/31/2019  8:55 AM 01/11/2018   10:42 AM 12/30/2016   10:42 AM 01/26/2016    8:36 AM  6CIT Screen  What Year? 0 points 0 points 0 points 0 points 0 points  What month? 0 points 0 points 0 points 0 points 0 points  What time? 0 points 0 points 0 points 0 points 0 points  Count  back from 20 0 points 0 points 0 points 0 points 0 points  Months in reverse 0 points 0 points 0 points 0 points 0 points  Repeat phrase 0 points 0 points 0 points 0 points 0 points  Total Score 0 points 0 points 0 points 0 points 0 points    Immunizations Immunization History  Administered Date(s) Administered   Fluad Quad(high Dose 65+) 12/26/2018   Influenza, High Dose Seasonal PF 12/30/2016, 02/07/2018, 01/28/2021   Influenza, Seasonal, Injecte, Preservative Fre 12/15/2011   Influenza,inj,Quad PF,6+ Mos 12/15/2012, 12/18/2013, 11/21/2014, 01/26/2016   Influenza-Unspecified 02/07/2018, 12/10/2019   PFIZER Comirnaty(Gray Top)Covid-19 Tri-Sucrose Vaccine 08/20/2020   PFIZER(Purple Top)SARS-COV-2 Vaccination 06/21/2019, 07/24/2019, 02/05/2020, 08/20/2020   Pneumococcal Conjugate-13 01/21/2015   Pneumococcal Polysaccharide-23 08/24/2012   Tdap 08/25/2009   Zoster, Live 06/22/2010    TDAP status: Due, Education has been provided regarding the importance of this vaccine. Advised may receive this vaccine at local pharmacy or Health Dept. Aware to provide a copy of the vaccination record if obtained from local pharmacy or Health Dept. Verbalized acceptance and understanding.  Flu Vaccine status: Due, Education has been provided regarding the importance of this vaccine. Advised may receive this vaccine at local pharmacy or Health Dept. Aware to provide a copy of the vaccination record if obtained from local pharmacy or Health Dept. Verbalized acceptance and understanding.  Pneumococcal vaccine status: Up to date  Covid-19 vaccine status: Information provided on how to obtain vaccines.   Qualifies for Shingles Vaccine? Yes   Zostavax completed No   Shingrix Completed?: No.    Education has been provided regarding the importance of this vaccine. Patient has been advised to call insurance company to determine out of pocket expense if they have not yet received this vaccine. Advised may also  receive vaccine at local pharmacy or Health Dept. Verbalized acceptance and understanding.  Screening Tests Health Maintenance  Topic Date Due   Zoster Vaccines- Shingrix (1 of 2) Never done   DTaP/Tdap/Td (2 - Td or Tdap) 08/26/2019   COVID-19 Vaccine (5 - 2023-24 season) 10/30/2021   INFLUENZA VACCINE  05/30/2022 (Originally 09/29/2021)   MAMMOGRAM  08/25/2022   Medicare Annual Wellness (AWV)  02/18/2023   COLONOSCOPY (Pts 45-21yr Insurance coverage will need to be confirmed)  01/06/2030   Pneumonia Vaccine 75 Years old  Completed   DEXA SCAN  Completed   Hepatitis C Screening  Addressed   HPV VACCINES  Aged Out    Health Maintenance  Health Maintenance Due  Topic Date Due   Zoster Vaccines- Shingrix (1 of 2) Never done   DTaP/Tdap/Td (2 - Td or Tdap) 08/26/2019   COVID-19 Vaccine (5 - 2023-24 season) 10/30/2021    Colorectal cancer screening: Type of screening: Colonoscopy. Completed 01/07/2020. Repeat every 10 years  Mammogram status: Completed 08/24/2021. Repeat every year  DEXA Scan: completed 02/20/19   Lung Cancer Screening: (Low Dose CT Chest recommended if Age 75-80years, 30 pack-year currently smoking OR have quit w/in 15years.) does not qualify.   Lung Cancer Screening Referral: not applicable   Additional Screening:  Hepatitis C Screening: does qualify; Completed 11/29/2014  Vision Screening: Recommended annual ophthalmology exams for early detection of glaucoma and other disorders of the eye. Is the patient up to date with their annual eye exam?  Yes  Who is the provider or what is the name of the office in which the patient attends annual eye exams? [facility]  If pt is not established with a provider, would they like to be referred to a provider to establish care? No .   Dental Screening: Recommended annual dental exams for proper oral hygiene  Community Resource Referral / Chronic Care Management: CRR required this visit?  No  CCM required  this visit?  No      Plan:     I have personally reviewed and noted the following in the patient's chart:   Medical and social history Use of alcohol, tobacco or illicit drugs  Current medications and supplements including opioid prescriptions. Patient is not currently taking opioid prescriptions. Functional ability and status Nutritional status Physical activity Advanced directives List of other physicians Hospitalizations, surgeries, and ER visits in previous 12 months Vitals Screenings to include cognitive, depression, and falls Referrals and appointments  In addition, I have reviewed and discussed with patient certain preventive protocols, quality metrics, and best practice recommendations. A written personalized care plan for preventive services as well as general preventive health recommendations were provided to patient.    Ms. Truluck , Thank you for taking time to come for your Medicare Wellness Visit. I appreciate your ongoing commitment to your health goals. Please review the following plan we discussed and let me know if I can assist you in the future.   These are the goals we discussed:  Goals       DIET - INCREASE WATER INTAKE      Recommend drinking 6-8 glasses of water per day       Exercise 150 minutes per week (moderate activity) (pt-stated)      Pt states she would like to walk or do some strengthening exercise, 150 minutes per week.        This is a list of the screening recommended for you and due dates:  Health Maintenance  Topic Date Due   Zoster (Shingles) Vaccine (1 of 2) Never done   DTaP/Tdap/Td vaccine (2 - Td or Tdap) 08/26/2019   COVID-19 Vaccine (5 - 2023-24 season) 10/30/2021   Flu Shot  05/30/2022*   Mammogram  08/25/2022   Medicare Annual Wellness Visit  02/18/2023   Colon Cancer Screening  01/06/2030   Pneumonia Vaccine  Completed   DEXA scan (bone density measurement)  Completed   Hepatitis C Screening: USPSTF Recommendation to  screen - Ages 21-79 yo.  Addressed   HPV Vaccine  Aged Out  *Topic was postponed. The date shown is not the original due date.     Wilson Singer, [place]   02/17/2022   Nurse Notes: Approximately 30 minute Non-Face -To-Face Medicare Wellness Visit  The pt complains of a mild nonproductive cough mostly at night x 3 days. She denies any other symptoms. She is currently treating her symptoms with simple nasal spray, Flonase, and Sudafed. I informed the patient if her symptoms persist, worsen, or do not improve. To call and schedule an appointment. She verbalized understanding.

## 2022-02-17 NOTE — Patient Instructions (Signed)

## 2022-02-23 ENCOUNTER — Ambulatory Visit
Admission: EM | Admit: 2022-02-23 | Discharge: 2022-02-23 | Disposition: A | Discharge status: Home / Self Care | Payer: Medicare HMO | Attending: Internal Medicine | Admitting: Internal Medicine

## 2022-02-23 ENCOUNTER — Telehealth: Payer: Self-pay

## 2022-02-23 DIAGNOSIS — Z20828 Contact with and (suspected) exposure to other viral communicable diseases: Secondary | ICD-10-CM | POA: Diagnosis not present

## 2022-02-23 DIAGNOSIS — J0101 Acute recurrent maxillary sinusitis: Secondary | ICD-10-CM | POA: Diagnosis not present

## 2022-02-23 DIAGNOSIS — J209 Acute bronchitis, unspecified: Secondary | ICD-10-CM

## 2022-02-23 MED ORDER — ALBUTEROL SULFATE HFA 108 (90 BASE) MCG/ACT IN AERS: 2.0000 | INHALATION_SPRAY | RESPIRATORY_TRACT | 0 refills | Status: DC | PRN

## 2022-02-23 MED ORDER — OSELTAMIVIR PHOSPHATE 75 MG PO CAPS: 75.0000 mg | ORAL_CAPSULE | Freq: Every day | ORAL | 0 refills | Status: DC

## 2022-02-23 MED ORDER — CLARITHROMYCIN ER 500 MG PO TB24: 1000.0000 mg | ORAL_TABLET | Freq: Every day | ORAL | 0 refills | Status: DC

## 2022-02-23 NOTE — ED Provider Notes (Signed)
[facility]    CSN: [id number] Arrival date & time: 02/23/22  0935      History   Chief Complaint Chief Complaint  Patient presents with   Cough   Nasal Congestion    HPI Dana Cameron is a 76 y.o. female who presents with onset of URI x 21 days. It started with a cough and she used several OTC meds to help symptoms and thought she was getting better. Has been having sinus pain and nose congestion x 2 days, and yellow mucous from nose and chest when she coughed this am. Up until yesterday she has had clean mucous production with cough. Has heard herself wheezing this am. She found out today her great granddaughter who she took care of yesterday tested positive for Influenza today. Pt denies body aches or HA.     Past Medical History:  Diagnosis Date   Allergy    Fall Seasonal Allergies   Arthritis of right hip    Cataract    Hyperlipidemia    PONV (postoperative nausea and vomiting)    Vaginal prolapse    Varicose veins of legs    has been treated    Patient Active Problem List   Diagnosis Date Noted   Special screening for malignant neoplasms, colon    History of nonmelanoma skin cancer 12/31/2019   Vitamin B 12 deficiency 08/01/2018   Varicose vein of leg 07/11/2018   Colon polyp, hyperplastic 01/28/2017   Arthritis of right hip 01/26/2016   Hyperlipidemia, mild 03/28/2015   Fibrocystic breast 09/17/2014   Environmental and seasonal allergies 09/17/2014   Pelvic prolapse 09/17/2014   Hot flash, menopausal 09/17/2014    Past Surgical History:  Procedure Laterality Date   BREAST BIOPSY Left 1993   neg   CATARACT EXTRACTION W/PHACO Left 08/12/2020   Procedure: CATARACT EXTRACTION PHACO AND INTRAOCULAR LENS PLACEMENT ([place]) LEFT;  Surgeon: Birder Robson, MD;  Location: [place];  Service: Ophthalmology;  Laterality: Left;  6.89 00:41.2   CATARACT EXTRACTION W/PHACO Right 09/23/2020   Procedure: CATARACT EXTRACTION PHACO AND  INTRAOCULAR LENS PLACEMENT ([facility]) RIGHT 3.01 00:22.6 ;  Surgeon: Birder Robson, MD;  Location: [place];  Service: Ophthalmology;  Laterality: Right;   COLONOSCOPY  12/31/2003   one hyperplastic polyp   COLONOSCOPY WITH PROPOFOL N/A 01/07/2020   Procedure: COLONOSCOPY WITH PROPOFOL;  Surgeon: Lucilla Lame, MD;  Location: [place];  Service: Endoscopy;  Laterality: N/A;  priority 4   EXCISION VAGINAL CYST     TOTAL KNEE ARTHROPLASTY Right 07/26/2017    OB History   No obstetric history on file.      Home Medications    Prior to Admission medications   Medication Sig Start Date End Date Taking? Authorizing Provider  albuterol (VENTOLIN HFA) 108 (90 Base) MCG/ACT inhaler Inhale 2 puffs into the lungs every 4 (four) hours as needed for wheezing or shortness of breath. 02/23/22  Yes Rodriguez-Southworth, Sunday Spillers, PA-C  cetirizine (ZYRTEC) 10 MG tablet Take 10 mg by mouth daily.   Yes [provider]  clarithromycin (BIAXIN XL) 500 MG 24 hr tablet Take 2 tablets (1,000 mg total) by mouth daily. 02/23/22  Yes Rodriguez-Southworth, Sunday Spillers, PA-C  Estradiol-Estriol-Progesterone (BIEST/PROGESTERONE) CREA APPLY 1 ML TOPICALLY EVERY DAY OR AS DIRECTED 09/11/20  Yes Glean Hess, MD  metronidazole (NORITATE) 1 % cream Apply topically daily as needed.   Yes [provider]  Nutritional Supplements (JUICE PLUS FIBRE PO) Take by mouth. Takes: Juice Plus  fruits, (2 caps AM), Juice Plus Veggies (2 caps PM), Juice Plus berries (1 cap AM & PM)   Yes [provider]  Omega 3 1000 MG CAPS Take 1 capsule by mouth 2 (two) times daily. Juice plus brand with 5 omegas   Yes [provider]  oseltamivir (TAMIFLU) 75 MG capsule Take 1 capsule (75 mg total) by mouth daily. For flu prevention 02/23/22  Yes Rodriguez-Southworth, Sunday Spillers, PA-C    Family History Family History  Problem Relation Age of Onset   Transient ischemic attack Mother    Stroke  Mother    Hypertension Father    Stroke Father    Asthma Sister    Lung cancer Brother    Cancer Brother    Varicose Veins Daughter    Breast cancer Neg Hx     Social History Social History   Tobacco Use   Smoking status: Never   Smokeless tobacco: Never  Vaping Use   Vaping Use: Never used  Substance Use Topics   Alcohol use: No    Alcohol/week: 0.0 standard drinks of alcohol   Drug use: No     Allergies   Other and Tape   Review of Systems Review of Systems  Constitutional:  Negative for activity change, appetite change, chills, diaphoresis, fatigue and fever.  HENT:  Positive for congestion, postnasal drip, sinus pressure and sinus pain. Negative for ear discharge, ear pain and sore throat.   Eyes:  Negative for discharge.  Respiratory:  Positive for cough and wheezing. Negative for chest tightness and shortness of breath.   Musculoskeletal:  Negative for gait problem.  Hematological:  Negative for adenopathy.     Physical Exam Triage Vital Signs ED Triage Vitals  Enc Vitals Group     BP 02/23/22 1031 134/83     Pulse Rate 02/23/22 1031 (!) 59     Resp 02/23/22 1031 18     Temp 02/23/22 1031 98.6 F (37 C)     Temp Source 02/23/22 1031 Oral     SpO2 02/23/22 1031 98 %     Weight 02/23/22 1030 170 lb (77.1 kg)     Height 02/23/22 1030 '5\' 7"'$  (1.702 m)     Head Circumference --      Peak Flow --      Pain Score 02/23/22 1030 5     Pain Loc --      Pain Edu? --      Excl. in [place]? --    No data found.  Updated Vital Signs BP 134/83 (BP Location: Left Arm)   Pulse (!) 59   Temp 98.6 F (37 C) (Oral)   Resp 18   Ht '5\' 7"'$  (1.702 m)   Wt 170 lb (77.1 kg)   SpO2 98%   BMI 26.63 kg/m   Visual Acuity Right Eye Distance:   Left Eye Distance:   Bilateral Distance:    Right Eye Near:   Left Eye Near:    Bilateral Near:      Physical Exam Constitutional:      General: He is not in acute distress.    Appearance: He is not toxic-appearing.   HENT:     Head: Normocephalic.     Right Ear: Tympanic membrane, ear canal and external ear normal.     Left Ear: Ear canal and external ear normal.     Nose: Nose normal. Maxillary and ethmoid sinuses are tender.     Mouth/Throat: clear    Mouth:  Mucous membranes are moist.     Pharynx: Oropharynx is clear.  Eyes:     General: No scleral icterus.    Conjunctiva/sclera: Conjunctivae normal.  Cardiovascular:     Rate and Rhythm: Normal rate and regular rhythm.     Heart sounds: No murmur heard.   Pulmonary:     Effort: Pulmonary effort is normal. No respiratory distress.     Breath sounds: mild expiratory Wheezing present.      Musculoskeletal:        General: Normal range of motion.     Cervical back: Neck supple.  Lymphadenopathy:     Cervical: No cervical adenopathy.  Skin:    General: Skin is warm and dry.     Findings: No rash.  Neurological:     Mental Status: He is alert and oriented to person, place, and time.     Gait: Gait normal.  Psychiatric:        Mood and Affect: Mood normal.        Behavior: Behavior normal.        Thought Content: Thought content normal.        Judgment: Judgment normal.    UC Treatments / Results  Labs (all labs ordered are listed, but only abnormal results are displayed) Labs Reviewed - No data to display  EKG   Radiology No results found.  Procedures Procedures (including critical care time)  Medications Ordered in UC Medications - No data to display  Initial Impression / Assessment and Plan / UC Course  I have reviewed the triage vital signs and the nursing notes.  Acute bronchitis Influenza Exposure  I placed her on Albuterol and Biaxen as noted. I also prescribed her Tamiflu 75 mg qd for flu prevention.   Final Clinical Impressions(s) / UC Diagnoses   Final diagnoses:  Exposure to influenza  Acute bronchitis, unspecified organism  Acute recurrent maxillary sinusitis     Discharge Instructions      Take  the Tamiflu one a day for prevention of the Flu since you were exposed yesterday, if you develop body aches and fever, then take it twice a day.      ED Prescriptions     Medication Sig Dispense Auth. Provider   albuterol (VENTOLIN HFA) 108 (90 Base) MCG/ACT inhaler Inhale 2 puffs into the lungs every 4 (four) hours as needed for wheezing or shortness of breath. 18 g Rodriguez-Southworth, Sunday Spillers, PA-C   clarithromycin (BIAXIN XL) 500 MG 24 hr tablet Take 2 tablets (1,000 mg total) by mouth daily. 7 tablet Rodriguez-Southworth, Sunday Spillers, PA-C   oseltamivir (TAMIFLU) 75 MG capsule Take 1 capsule (75 mg total) by mouth daily. For flu prevention 10 capsule Rodriguez-Southworth, Sunday Spillers, PA-C      PDMP not reviewed this encounter.   Shelby Mattocks, PA-C 02/23/22 1053

## 2022-02-23 NOTE — Telephone Encounter (Signed)
Received call from [place] in regards to rx clarification on pt's clarithromycin. S/w Sandrea Matte & pt is supposed to be on rx for 7 days w/14 tabs needed. Gave verbal order per Sandrea Matte, pharmacy verbalized understanding to info.

## 2022-02-23 NOTE — Discharge Instructions (Signed)
Take the Tamiflu one a day for prevention of the Flu since you were exposed yesterday, if you develop body aches and fever, then take it twice a day.

## 2022-02-23 NOTE — ED Triage Notes (Signed)
Pt c/o sinus pain, nasal drainage, Dark yellow phlegm with cough, chest gurgling when breathing  x21days

## 2022-03-14 ENCOUNTER — Ambulatory Visit (INDEPENDENT_AMBULATORY_CARE_PROVIDER_SITE_OTHER): Payer: Medicare HMO

## 2022-03-14 ENCOUNTER — Telehealth: Payer: Self-pay | Admitting: Family Medicine

## 2022-03-14 ENCOUNTER — Ambulatory Visit
Admission: RE | Admit: 2022-03-14 | Discharge: 2022-03-14 | Disposition: A | Discharge status: Home / Self Care | Payer: Medicare HMO | Source: Ambulatory Visit | Attending: Family Medicine | Admitting: Family Medicine

## 2022-03-14 VITALS — BP 154/83 | HR 76 | Temp 98.0°F | Resp 14 | Ht 67.0 in | Wt 170.0 lb

## 2022-03-14 DIAGNOSIS — J22 Unspecified acute lower respiratory infection: Secondary | ICD-10-CM | POA: Insufficient documentation

## 2022-03-14 DIAGNOSIS — R059 Cough, unspecified: Secondary | ICD-10-CM | POA: Diagnosis not present

## 2022-03-14 DIAGNOSIS — Z1152 Encounter for screening for COVID-19: Secondary | ICD-10-CM | POA: Diagnosis not present

## 2022-03-14 HISTORY — DX: Encounter for screening for malignant neoplasm of colon: Z12.11

## 2022-03-14 LAB — RESP PANEL BY RT-PCR (RSV, FLU A&B, COVID)  RVPGX2
Influenza A by PCR: NEGATIVE
Influenza B by PCR: NEGATIVE
Resp Syncytial Virus by PCR: NEGATIVE
SARS Coronavirus 2 by RT PCR: NEGATIVE

## 2022-03-14 MED ORDER — BENZONATATE 100 MG PO CAPS: 200.0000 mg | ORAL_CAPSULE | Freq: Three times a day (TID) | ORAL | 0 refills | Status: DC | PRN

## 2022-03-14 MED ORDER — IPRATROPIUM BROMIDE 0.06 % NA SOLN: 2.0000 | Freq: Four times a day (QID) | NASAL | 12 refills | Status: AC

## 2022-03-14 MED ORDER — PROMETHAZINE-DM 6.25-15 MG/5ML PO SYRP: 5.0000 mL | ORAL_SOLUTION | Freq: Four times a day (QID) | ORAL | 0 refills | Status: DC | PRN

## 2022-03-14 MED ORDER — CEFDINIR 300 MG PO CAPS: 300.0000 mg | ORAL_CAPSULE | Freq: Two times a day (BID) | ORAL | 0 refills | Status: AC

## 2022-03-14 MED ORDER — PREDNISONE 50 MG PO TABS: 50.0000 mg | ORAL_TABLET | Freq: Every day | ORAL | 0 refills | Status: AC

## 2022-03-14 MED ORDER — IPRATROPIUM BROMIDE 0.02 % IN SOLN: 0.5000 mg | Freq: Four times a day (QID) | RESPIRATORY_TRACT | 12 refills | Status: DC

## 2022-03-14 NOTE — Telephone Encounter (Addendum)
Updated pt's atrovent nasal spray Rx.  Her COVID, RSV and influenza tests are negative.  Cefdinir sent to pharmacy.  Pt called and updated on results.   Lyndee Hensen, DO

## 2022-03-14 NOTE — Telephone Encounter (Signed)
Added prednisone.

## 2022-03-14 NOTE — Discharge Instructions (Addendum)
Your chest xray did not show a pneumonia.  I will call you if your COVID, influenza and RSV is positive.

## 2022-03-14 NOTE — ED Triage Notes (Signed)
Patient was seen here on 02/24/23 for URI.  Patient reports on going cough, congestion and now sinus pressure and congestion.  Patient denies fevers.

## 2022-03-14 NOTE — Addendum Note (Signed)
Addended byLyndee Hensen D on: 03/14/2022 05:00 PM   Modules accepted: Orders

## 2022-03-14 NOTE — ED Provider Notes (Signed)
[facility]    CSN: [id number] Arrival date & time: 03/14/22  1106      History   Chief Complaint Chief Complaint  Patient presents with   Cough    Appointment    HPI Dana Cameron is a 77 y.o. female.   HPI   Dana Cameron presents for ongoing cough overnight, nasal drainage, green sputum that is worse in the morning. At times, she coughs so her she gags.  No fever, vomiting, chest tightness or pain. No shortness of breath. Has been using cough drops, Delsym, and Sudafed without relief.   Was seen on 02/23/22 at the urgent care and diagnosed with bronchitis. Took antibiotics which helped somewhat but did not completely take the cough away.  Had some diarrhea afterwards but has resolved.    Past Medical History:  Diagnosis Date   Allergy    Fall Seasonal Allergies   Arthritis of right hip    Cataract    Hyperlipidemia    PONV (postoperative nausea and vomiting)    Special screening for malignant neoplasms, colon    Vaginal prolapse    Varicose veins of legs    has been treated    Patient Active Problem List   Diagnosis Date Noted   Special screening for malignant neoplasms, colon    History of nonmelanoma skin cancer 12/31/2019   Vitamin B 12 deficiency 08/01/2018   Varicose vein of leg 07/11/2018   Colon polyp, hyperplastic 01/28/2017   Arthritis of right hip 01/26/2016   Hyperlipidemia, mild 03/28/2015   Fibrocystic breast 09/17/2014   Environmental and seasonal allergies 09/17/2014   Pelvic prolapse 09/17/2014   Hot flash, menopausal 09/17/2014    Past Surgical History:  Procedure Laterality Date   BREAST BIOPSY Left 1993   neg   CATARACT EXTRACTION W/PHACO Left 08/12/2020   Procedure: CATARACT EXTRACTION PHACO AND INTRAOCULAR LENS PLACEMENT ([place]) LEFT;  Surgeon: Birder Robson, MD;  Location: [facility];  Service: Ophthalmology;  Laterality: Left;  6.89 00:41.2   CATARACT EXTRACTION W/PHACO Right 09/23/2020   Procedure:  CATARACT EXTRACTION PHACO AND INTRAOCULAR LENS PLACEMENT ([facility]) RIGHT 3.01 00:22.6 ;  Surgeon: Birder Robson, MD;  Location: [facility];  Service: Ophthalmology;  Laterality: Right;   COLONOSCOPY  12/31/2003   one hyperplastic polyp   COLONOSCOPY WITH PROPOFOL N/A 01/07/2020   Procedure: COLONOSCOPY WITH PROPOFOL;  Surgeon: Lucilla Lame, MD;  Location: [facility];  Service: Endoscopy;  Laterality: N/A;  priority 4   EXCISION VAGINAL CYST     TOTAL KNEE ARTHROPLASTY Right 07/26/2017    OB History   No obstetric history on file.      Home Medications    Prior to Admission medications   Medication Sig Start Date End Date Taking? Authorizing Provider  benzonatate (TESSALON) 100 MG capsule Take 2 capsules (200 mg total) by mouth 3 (three) times daily as needed for cough. 03/14/22  Yes Dana Frankie, DO  promethazine-dextromethorphan (PROMETHAZINE-DM) 6.25-15 MG/5ML syrup Take 5 mLs by mouth 4 (four) times daily as needed. 03/14/22  Yes Dana Gibbs, DO  albuterol (VENTOLIN HFA) 108 (90 Base) MCG/ACT inhaler Inhale 2 puffs into the lungs every 4 (four) hours as needed for wheezing or shortness of breath. 02/23/22   Rodriguez-Southworth, Sunday Spillers, PA-C  cefdinir (OMNICEF) 300 MG capsule Take 1 capsule (300 mg total) by mouth 2 (two) times daily for 7 days. 03/14/22 03/21/22  Lyndee Hensen, DO  cetirizine (ZYRTEC) 10 MG tablet Take 10 mg by mouth daily.  [provider]  Estradiol-Estriol-Progesterone (BIEST/PROGESTERONE) CREA APPLY 1 ML TOPICALLY EVERY DAY OR AS DIRECTED 09/11/20   Glean Hess, MD  ipratropium (ATROVENT) 0.06 % nasal spray Place 2 sprays into both nostrils 4 (four) times daily. 03/14/22   Lyndee Hensen, DO  metronidazole (NORITATE) 1 % cream Apply topically daily as needed.    [provider]  Nutritional Supplements (JUICE PLUS FIBRE PO) Take by mouth. Takes: Juice Plus fruits, (2 caps AM), Juice Plus Veggies (2 caps PM), Juice Plus  berries (1 cap AM & PM)    [provider]  Omega 3 1000 MG CAPS Take 1 capsule by mouth 2 (two) times daily. Juice plus brand with 5 omegas    [provider]  predniSONE (DELTASONE) 50 MG tablet Take 1 tablet (50 mg total) by mouth daily for 3 days. 03/14/22 03/17/22  Lyndee Hensen, DO    Family History Family History  Problem Relation Age of Onset   Transient ischemic attack Mother    Stroke Mother    Hypertension Father    Stroke Father    Asthma Sister    Lung cancer Brother    Cancer Brother    Varicose Veins Daughter    Breast cancer Neg Hx     Social History Social History   Tobacco Use   Smoking status: Never   Smokeless tobacco: Never  Vaping Use   Vaping Use: Never used  Substance Use Topics   Alcohol use: No    Alcohol/week: 0.0 standard drinks of alcohol   Drug use: No     Allergies   Other and Tape   Review of Systems Review of Systems: negative unless otherwise stated in HPI.      Physical Exam Triage Vital Signs ED Triage Vitals  Enc Vitals Group     BP 03/14/22 1117 (!) 154/83     Pulse Rate 03/14/22 1116 76     Resp 03/14/22 1116 14     Temp 03/14/22 1116 98 F (36.7 C)     Temp Source 03/14/22 1116 Oral     SpO2 03/14/22 1116 98 %     Weight 03/14/22 1113 169 lb 15.6 oz (77.1 kg)     Height 03/14/22 1113 '5\' 7"'$  (1.702 m)     Head Circumference --      Peak Flow --      Pain Score 03/14/22 1113 2     Pain Loc --      Pain Edu? --      Excl. in [place]? --    No data found.  Updated Vital Signs BP (!) 154/83 (BP Location: Right Arm)   Pulse 76   Temp 98 F (36.7 C) (Oral)   Resp 14   Ht '5\' 7"'$  (1.702 m)   Wt 77.1 kg   SpO2 98%   BMI 26.62 kg/m   Visual Acuity Right Eye Distance:   Left Eye Distance:   Bilateral Distance:    Right Eye Near:   Left Eye Near:    Bilateral Near:     Physical Exam GEN:     alert, non-toxic appearing female in no distress    HENT:  mucus membranes moist, oropharyngeal  without erythema, lesions or exudate, no tonsillar hypertrophy, clear nasal discharge,  EYES:   wearing glasses, no scleral injection or discharge NECK:  normal ROM, no lymphadenopathy, no meningismus   RESP:  no increased work of breathing, right upper rhonchi CVS:   regular rate and  rhythm Skin:   warm and dry    UC Treatments / Results  Labs (all labs ordered are listed, but only abnormal results are displayed) Labs Reviewed  RESP PANEL BY RT-PCR (RSV, FLU A&B, COVID)  [id number]    EKG   Radiology No results found.  Procedures Procedures (including critical care time)  Medications Ordered in UC Medications - No data to display  Initial Impression / Assessment and Plan / UC Course  I have reviewed the triage vital signs and the nursing notes.  Pertinent labs & imaging results that were available during my care of the patient were reviewed by me and considered in my medical decision making (see chart for details).      Pt is a 77 y.o. female who presents for 5-6 weeks of cough with new upper respiratory symptoms.    [place] is afebrile here without recent antipyretics. Satting well, on room air. Overall pt is  non-toxic appearing, well hydrated, without respiratory distress. Pulmonary exam is remarkable for rhonchi in right upper lobe.  After shared decision making, we will pursue chest x-ray. Given acute respiratory symptoms RSV, COVID  and influenza testing obtained and were negative. Chest xray personally reviewed by me without focal pneumonia, pleural effusion, cardiomegaly or pneumothorax.   Treat acute bronchitis with prednisone and steroids and antibiotics.  Tessalon perles and Promethazine DM cough syrup given for cough and allow patient to rest.  Typical duration of symptoms discussed. Return and ED precautions given and patient voiced understanding.   Discussed MDM, treatment plan and plan for follow-up with patient who agrees with plan.     Final Clinical  Impressions(s) / UC Diagnoses   Final diagnoses:  Acute respiratory infection     Discharge Instructions      Your chest xray did not show a pneumonia.  I will call you if your COVID, influenza and RSV is positive.       ED Prescriptions     Medication Sig Dispense Auth. Provider   promethazine-dextromethorphan (PROMETHAZINE-DM) 6.25-15 MG/5ML syrup Take 5 mLs by mouth 4 (four) times daily as needed. 118 mL Dana Sobalvarro, DO   benzonatate (TESSALON) 100 MG capsule Take 2 capsules (200 mg total) by mouth 3 (three) times daily as needed for cough. 21 capsule Lyndee Hensen, DO      PDMP not reviewed this encounter.   Lyndee Hensen, DO 03/16/22 1520

## 2022-04-05 DIAGNOSIS — L578 Other skin changes due to chronic exposure to nonionizing radiation: Secondary | ICD-10-CM | POA: Diagnosis not present

## 2022-04-05 DIAGNOSIS — L820 Inflamed seborrheic keratosis: Secondary | ICD-10-CM | POA: Diagnosis not present

## 2022-04-05 DIAGNOSIS — D1801 Hemangioma of skin and subcutaneous tissue: Secondary | ICD-10-CM | POA: Diagnosis not present

## 2022-04-05 DIAGNOSIS — L918 Other hypertrophic disorders of the skin: Secondary | ICD-10-CM | POA: Diagnosis not present

## 2022-07-05 DIAGNOSIS — D2310 Other benign neoplasm of skin of unspecified eyelid, including canthus: Secondary | ICD-10-CM | POA: Diagnosis not present

## 2022-07-05 DIAGNOSIS — H26491 Other secondary cataract, right eye: Secondary | ICD-10-CM | POA: Diagnosis not present

## 2022-07-05 DIAGNOSIS — H43813 Vitreous degeneration, bilateral: Secondary | ICD-10-CM | POA: Diagnosis not present

## 2022-07-14 DIAGNOSIS — M793 Panniculitis, unspecified: Secondary | ICD-10-CM | POA: Diagnosis not present

## 2022-07-14 DIAGNOSIS — D2272 Melanocytic nevi of left lower limb, including hip: Secondary | ICD-10-CM | POA: Diagnosis not present

## 2022-07-14 DIAGNOSIS — R238 Other skin changes: Secondary | ICD-10-CM | POA: Diagnosis not present

## 2022-07-14 DIAGNOSIS — D2271 Melanocytic nevi of right lower limb, including hip: Secondary | ICD-10-CM | POA: Diagnosis not present

## 2022-07-14 DIAGNOSIS — D2262 Melanocytic nevi of left upper limb, including shoulder: Secondary | ICD-10-CM | POA: Diagnosis not present

## 2022-07-14 DIAGNOSIS — L814 Other melanin hyperpigmentation: Secondary | ICD-10-CM | POA: Diagnosis not present

## 2022-07-14 DIAGNOSIS — L608 Other nail disorders: Secondary | ICD-10-CM | POA: Diagnosis not present

## 2022-07-14 DIAGNOSIS — D225 Melanocytic nevi of trunk: Secondary | ICD-10-CM | POA: Diagnosis not present

## 2022-07-14 DIAGNOSIS — D2261 Melanocytic nevi of right upper limb, including shoulder: Secondary | ICD-10-CM | POA: Diagnosis not present

## 2022-07-14 DIAGNOSIS — L821 Other seborrheic keratosis: Secondary | ICD-10-CM | POA: Diagnosis not present

## 2022-07-28 DIAGNOSIS — M9901 Segmental and somatic dysfunction of cervical region: Secondary | ICD-10-CM | POA: Diagnosis not present

## 2022-07-28 DIAGNOSIS — M9902 Segmental and somatic dysfunction of thoracic region: Secondary | ICD-10-CM | POA: Diagnosis not present

## 2022-07-28 DIAGNOSIS — M5033 Other cervical disc degeneration, cervicothoracic region: Secondary | ICD-10-CM | POA: Diagnosis not present

## 2022-07-28 DIAGNOSIS — M62830 Muscle spasm of back: Secondary | ICD-10-CM | POA: Diagnosis not present

## 2022-08-04 ENCOUNTER — Encounter: Payer: Self-pay | Admitting: Podiatry

## 2022-08-04 ENCOUNTER — Ambulatory Visit: Payer: Medicare HMO | Admitting: Podiatry

## 2022-08-04 DIAGNOSIS — B351 Tinea unguium: Secondary | ICD-10-CM | POA: Diagnosis not present

## 2022-08-04 DIAGNOSIS — M79676 Pain in unspecified toe(s): Secondary | ICD-10-CM

## 2022-08-04 NOTE — Progress Notes (Signed)
She presents today concerned about her fourth toenail on her right foot she states that it is thickened curled and sometimes is tender.  Objective: Vital signs stable alert oriented x 3 there is no erythema edema cellulitis drainage or odor she has hammertoe deformity with osteoarthritis the PIPJ and DIPJ fourth digit of the right foot with a thick encrypted nail.  This does demonstrate a subungual hematoma as well.  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  Plan: I debrided the nail today.  And we will follow-up with her on an as-needed basis.

## 2022-08-09 ENCOUNTER — Ambulatory Visit
Admission: RE | Admit: 2022-08-09 | Discharge: 2022-08-09 | Disposition: A | Discharge status: Home / Self Care | Payer: Medicare HMO | Source: Ambulatory Visit | Attending: Family Medicine | Admitting: Family Medicine

## 2022-08-09 VITALS — BP 145/84 | HR 62 | Temp 98.6°F

## 2022-08-09 DIAGNOSIS — N3001 Acute cystitis with hematuria: Secondary | ICD-10-CM | POA: Diagnosis not present

## 2022-08-09 DIAGNOSIS — J069 Acute upper respiratory infection, unspecified: Secondary | ICD-10-CM

## 2022-08-09 LAB — URINALYSIS, W/ REFLEX TO CULTURE (INFECTION SUSPECTED)
Bilirubin Urine: NEGATIVE
Glucose, UA: NEGATIVE mg/dL
Ketones, ur: NEGATIVE mg/dL
Nitrite: NEGATIVE
Protein, ur: NEGATIVE mg/dL
Specific Gravity, Urine: 1.01 (ref 1.005–1.030)
WBC, UA: >50 WBC/hpf (ref 0–5)
pH: 5.5 (ref 5.0–8.0)

## 2022-08-09 MED ORDER — FLUCONAZOLE 150 MG PO TABS: 150.0000 mg | ORAL_TABLET | ORAL | 0 refills | Status: AC

## 2022-08-09 MED ORDER — CEFDINIR 300 MG PO CAPS: 300.0000 mg | ORAL_CAPSULE | Freq: Two times a day (BID) | ORAL | 0 refills | Status: DC

## 2022-08-09 NOTE — ED Provider Notes (Signed)
[facility]    CSN: [id number] Arrival date & time: 08/09/22  1057      History   Chief Complaint Chief Complaint  Patient presents with   Nasal Congestion   Cough   Dysuria     HPI HPI Dana Cameron is a 77 y.o. female.    Dana Cameron presents for dysuria and urinary difficulty for about 5 days now.  Tried Azo without relief..  She is postmenopausal.  Denies vaginal discharge, vaginal odor, nausea, vomiting, back pain.  She feels her vagina when she wipes and has a known genital prolapse.  Has nasal congestion, ear fullness, headache and sinus pressure. She had similar sx in November. No fever, chest pain or shortness of breath.  Has cough.            Past Medical History:  Diagnosis Date   Allergy    Fall Seasonal Allergies   Arthritis of right hip    Cataract    Hyperlipidemia    PONV (postoperative nausea and vomiting)    Special screening for malignant neoplasms, colon    Vaginal prolapse    Varicose veins of legs    has been treated    Patient Active Problem List   Diagnosis Date Noted   Special screening for malignant neoplasms, colon    History of nonmelanoma skin cancer 12/31/2019   Vitamin B 12 deficiency 08/01/2018   Varicose vein of leg 07/11/2018   Colon polyp, hyperplastic 01/28/2017   Arthritis of right hip 01/26/2016   Hyperlipidemia, mild 03/28/2015   Fibrocystic breast 09/17/2014   Environmental and seasonal allergies 09/17/2014   Pelvic prolapse 09/17/2014   Hot flash, menopausal 09/17/2014    Past Surgical History:  Procedure Laterality Date   BREAST BIOPSY Left 1993   neg   CATARACT EXTRACTION W/PHACO Left 08/12/2020   Procedure: CATARACT EXTRACTION PHACO AND INTRAOCULAR LENS PLACEMENT ([facility]) LEFT;  Surgeon: Galen Manila, MD;  Location: [facility];  Service: Ophthalmology;  Laterality: Left;  6.89 00:41.2   CATARACT EXTRACTION W/PHACO Right 09/23/2020   Procedure: CATARACT EXTRACTION  PHACO AND INTRAOCULAR LENS PLACEMENT ([facility]) RIGHT 3.01 00:22.6 ;  Surgeon: Galen Manila, MD;  [facility];  Service: Ophthalmology;  Laterality: Right;   COLONOSCOPY  12/31/2003   one hyperplastic polyp   COLONOSCOPY WITH PROPOFOL N/A 01/07/2020   Procedure: COLONOSCOPY WITH PROPOFOL;  Surgeon: Midge Minium, MD;  Location: [facility];  Service: Endoscopy;  Laterality: N/A;  priority 4   EXCISION VAGINAL CYST     TOTAL KNEE ARTHROPLASTY Right 07/26/2017    OB History   No obstetric history on file.      Home Medications    Prior to Admission medications   Medication Sig Start Date End Date Taking? Authorizing Provider  cefdinir (OMNICEF) 300 MG capsule Take 1 capsule (300 mg total) by mouth 2 (two) times daily. 08/09/22  Yes Nikeia Henkes, DO  fluconazole (DIFLUCAN) 150 MG tablet Take 1 tablet (150 mg total) by mouth every 3 (three) days for 2 doses. 08/09/22 08/13/22 Yes Layanna Charo, DO  albuterol (VENTOLIN HFA) 108 (90 Base) MCG/ACT inhaler Inhale 2 puffs into the lungs every 4 (four) hours as needed for wheezing or shortness of breath. 02/23/22   Rodriguez-Southworth, Nettie Elm, PA-C  cetirizine (ZYRTEC) 10 MG tablet Take 10 mg by mouth daily.    [provider]  Estradiol-Estriol-Progesterone (BIEST/PROGESTERONE) CREA APPLY 1 ML TOPICALLY EVERY DAY OR AS DIRECTED 09/11/20   Bari Edward  H, MD  ipratropium (ATROVENT) 0.06 % nasal spray Place 2 sprays into both nostrils 4 (four) times daily. 03/14/22   Katha Cabal, DO  metronidazole (NORITATE) 1 % cream Apply topically daily as needed.    [provider]  Nutritional Supplements (JUICE PLUS FIBRE PO) Take by mouth. Takes: Juice Plus fruits, (2 caps AM), Juice Plus Veggies (2 caps PM), Juice Plus berries (1 cap AM & PM)    [provider]  Omega 3 1000 MG CAPS Take 1 capsule by mouth 2 (two) times daily. Juice plus brand with 5 omegas    [provider]    Family  History Family History  Problem Relation Age of Onset   Transient ischemic attack Mother    Stroke Mother    Hypertension Father    Stroke Father    Asthma Sister    Lung cancer Brother    Cancer Brother    Varicose Veins Daughter    Breast cancer Neg Hx     Social History Social History   Tobacco Use   Smoking status: Never   Smokeless tobacco: Never  Vaping Use   Vaping Use: Never used  Substance Use Topics   Alcohol use: No    Alcohol/week: 0.0 standard drinks of alcohol   Drug use: No     Allergies   Latex, Other, and Tape   Review of Systems Review of Systems: :negative unless otherwise stated in HPI.      Physical Exam Triage Vital Signs ED Triage Vitals [08/09/22 1117]  Enc Vitals Group     BP (!) 145/84     Pulse Rate 62     Resp      Temp 98.6 F (37 C)     Temp Source Oral     SpO2 95 %     Weight      Height      Head Circumference      Peak Flow      Pain Score 0     Pain Loc      Pain Edu?      Excl. in [place]?    No data found.  Updated Vital Signs BP (!) 145/84 (BP Location: Right Arm)   Pulse 62   Temp 98.6 F (37 C) (Oral)   SpO2 95%   Visual Acuity Right Eye Distance:   Left Eye Distance:   Bilateral Distance:    Right Eye Near:   Left Eye Near:    Bilateral Near:     Physical Exam GEN:     alert, well appearing female and no distress    HENT:  :  mucus membranes moist, oropharyngeal without lesions,no erythema, nares patent, clear nasal discharge, bilateral TM normal EYES:   pupils equal and reactive, scleral injection RESP:  clear to auscultation bilaterally, no increased work of breathing  CVS:   regular rate and rhythm Skin:   warm and dry, no rash on visible skin       UC Treatments / Results  Labs (all labs ordered are listed, but only abnormal results are displayed) Labs Reviewed  URINALYSIS, W/ REFLEX TO CULTURE (INFECTION SUSPECTED) - Abnormal; Notable for the following components:      Result Value    APPearance HAZY (*)    Hgb urine dipstick TRACE (*)    Leukocytes,Ua LARGE (*)    Bacteria, UA MANY (*)    All other components within normal limits  URINE CULTURE    EKG  Radiology No results found.  Procedures Procedures (including critical care time)  Medications Ordered in UC Medications - No data to display  Initial Impression / Assessment and Plan / UC Course  I have reviewed the triage vital signs and the nursing notes.  Pertinent labs & imaging results that were available during my care of the patient were reviewed by me and considered in my medical decision making (see chart for details).      Patient is a 77 y.o.Mar[place] Kitchen female  who presents for 5 days of dysuria.  Overall patient is well-appearing and afebrile.  Vital signs stable.  UA consistent with acute cystitis.   Hematuria supported on microscopy.  Treat with Cefdinir 2 times daily for 5 days. Urine culture obtained.  Follow-up sensitivities and change antibiotics, if needed.  Yeast seen on urinalysis.  Treat with diflucan.  Return precautions including abdominal pain, fever, chills, nausea, or vomiting given.  She reports genital prolapse for a while now that she can feel when she wipes. This has been present for a while now.   She does not have a pessary and is interested in one. Given contact information for [facility]. Pt to call and schedule an appointment.   Pt with respiratory symptoms for 5-6 days. Suspect viral illness but pt requesting antibiotic.  She is being treated with Cefdinir for UTI. This will also cover for possible bacterial sinobronchitis.   Discussed MDM, treatment plan and plan for follow-up with patient who agrees with plan.      Final Clinical Impressions(s) / UC Diagnoses   Final diagnoses:  Acute cystitis with hematuria  Upper respiratory tract infection, unspecified type     Discharge Instructions      Call the [facility] to discuss a pessary.  Stop by the pharmacy to  pick up your prescriptions.       ED Prescriptions     Medication Sig Dispense Auth. Provider   cefdinir (OMNICEF) 300 MG capsule Take 1 capsule (300 mg total) by mouth 2 (two) times daily. 10 capsule Belma Dyches, DO   fluconazole (DIFLUCAN) 150 MG tablet Take 1 tablet (150 mg total) by mouth every 3 (three) days for 2 doses. 2 tablet Katha Cabal, DO      PDMP not reviewed this encounter.   Katha Cabal, DO 08/09/22 1208

## 2022-08-09 NOTE — Discharge Instructions (Addendum)
Call the [facility] to discuss a pessary.  Stop by the pharmacy to pick up your prescriptions.

## 2022-08-09 NOTE — ED Triage Notes (Signed)
Pt c/o cough, congestion onset last Wednesday, pt states she was outside picking up some limbs in yard. Pt states she has been taking some leftover benzonatate, and cough syrup she was given last year for sxs and has been helping.   Pt also c/o dysuria, frequency onset last Wednesday, pt has been taking azo.

## 2022-08-11 LAB — URINE CULTURE: Culture: >=100000 — AB

## 2022-08-25 DIAGNOSIS — M5033 Other cervical disc degeneration, cervicothoracic region: Secondary | ICD-10-CM | POA: Diagnosis not present

## 2022-08-25 DIAGNOSIS — M9902 Segmental and somatic dysfunction of thoracic region: Secondary | ICD-10-CM | POA: Diagnosis not present

## 2022-08-25 DIAGNOSIS — M62830 Muscle spasm of back: Secondary | ICD-10-CM | POA: Diagnosis not present

## 2022-08-25 DIAGNOSIS — M9901 Segmental and somatic dysfunction of cervical region: Secondary | ICD-10-CM | POA: Diagnosis not present

## 2022-09-22 DIAGNOSIS — M9902 Segmental and somatic dysfunction of thoracic region: Secondary | ICD-10-CM | POA: Diagnosis not present

## 2022-09-22 DIAGNOSIS — M62830 Muscle spasm of back: Secondary | ICD-10-CM | POA: Diagnosis not present

## 2022-09-22 DIAGNOSIS — M9901 Segmental and somatic dysfunction of cervical region: Secondary | ICD-10-CM | POA: Diagnosis not present

## 2022-09-22 DIAGNOSIS — M5033 Other cervical disc degeneration, cervicothoracic region: Secondary | ICD-10-CM | POA: Diagnosis not present

## 2022-10-11 ENCOUNTER — Other Ambulatory Visit: Payer: Self-pay | Admitting: Internal Medicine

## 2022-10-11 DIAGNOSIS — Z1231 Encounter for screening mammogram for malignant neoplasm of breast: Secondary | ICD-10-CM

## 2022-10-20 DIAGNOSIS — M62830 Muscle spasm of back: Secondary | ICD-10-CM | POA: Diagnosis not present

## 2022-10-20 DIAGNOSIS — M9902 Segmental and somatic dysfunction of thoracic region: Secondary | ICD-10-CM | POA: Diagnosis not present

## 2022-10-20 DIAGNOSIS — M9901 Segmental and somatic dysfunction of cervical region: Secondary | ICD-10-CM | POA: Diagnosis not present

## 2022-10-20 DIAGNOSIS — M5033 Other cervical disc degeneration, cervicothoracic region: Secondary | ICD-10-CM | POA: Diagnosis not present

## 2022-11-04 ENCOUNTER — Ambulatory Visit
Admission: RE | Admit: 2022-11-04 | Discharge: 2022-11-04 | Disposition: A | Discharge status: Home / Self Care | Payer: Medicare HMO | Source: Ambulatory Visit | Attending: Internal Medicine | Admitting: Internal Medicine

## 2022-11-04 DIAGNOSIS — Z1231 Encounter for screening mammogram for malignant neoplasm of breast: Secondary | ICD-10-CM | POA: Diagnosis not present

## 2022-11-16 DIAGNOSIS — M62830 Muscle spasm of back: Secondary | ICD-10-CM | POA: Diagnosis not present

## 2022-11-16 DIAGNOSIS — M9902 Segmental and somatic dysfunction of thoracic region: Secondary | ICD-10-CM | POA: Diagnosis not present

## 2022-11-16 DIAGNOSIS — M5033 Other cervical disc degeneration, cervicothoracic region: Secondary | ICD-10-CM | POA: Diagnosis not present

## 2022-11-16 DIAGNOSIS — M9901 Segmental and somatic dysfunction of cervical region: Secondary | ICD-10-CM | POA: Diagnosis not present

## 2022-12-03 ENCOUNTER — Ambulatory Visit
Admission: EM | Admit: 2022-12-03 | Discharge: 2022-12-03 | Disposition: A | Discharge status: Home / Self Care | Payer: Medicare HMO | Attending: Family Medicine | Admitting: Family Medicine

## 2022-12-03 ENCOUNTER — Ambulatory Visit (INDEPENDENT_AMBULATORY_CARE_PROVIDER_SITE_OTHER): Payer: Medicare HMO

## 2022-12-03 DIAGNOSIS — J989 Respiratory disorder, unspecified: Secondary | ICD-10-CM | POA: Diagnosis not present

## 2022-12-03 DIAGNOSIS — Z1152 Encounter for screening for COVID-19: Secondary | ICD-10-CM | POA: Diagnosis not present

## 2022-12-03 DIAGNOSIS — R918 Other nonspecific abnormal finding of lung field: Secondary | ICD-10-CM | POA: Diagnosis not present

## 2022-12-03 DIAGNOSIS — R058 Other specified cough: Secondary | ICD-10-CM | POA: Diagnosis not present

## 2022-12-03 DIAGNOSIS — J189 Pneumonia, unspecified organism: Secondary | ICD-10-CM | POA: Diagnosis not present

## 2022-12-03 DIAGNOSIS — R062 Wheezing: Secondary | ICD-10-CM | POA: Diagnosis not present

## 2022-12-03 LAB — RESP PANEL BY RT-PCR (RSV, FLU A&B, COVID)  RVPGX2
Influenza A by PCR: NEGATIVE
Influenza B by PCR: NEGATIVE
Resp Syncytial Virus by PCR: NEGATIVE
SARS Coronavirus 2 by RT PCR: NEGATIVE

## 2022-12-03 MED ORDER — ALBUTEROL SULFATE HFA 108 (90 BASE) MCG/ACT IN AERS: 2.0000 | INHALATION_SPRAY | RESPIRATORY_TRACT | 0 refills | Status: AC | PRN

## 2022-12-03 MED ORDER — AZITHROMYCIN 250 MG PO TABS: ORAL_TABLET | ORAL | 0 refills | Status: DC

## 2022-12-03 MED ORDER — AMOXICILLIN-POT CLAVULANATE 875-125 MG PO TABS: 1.0000 | ORAL_TABLET | Freq: Two times a day (BID) | ORAL | 0 refills | Status: DC

## 2022-12-03 NOTE — ED Provider Notes (Signed)
[facility]    CSN: [id number] Arrival date & time: 12/03/22  1028      History   Chief Complaint Chief Complaint  Patient presents with   Cough   Nasal Congestion   Generalized Body Aches    HPI Eyonna Sandstrom is a 77 y.o. female.   HPI  History obtained from the patient. Laurissa presents for productive cough with nonbloody yellow phlegm, wheezing and bodyaches for the past 5 days.  She got her flu shot on 11/22/2022.  About a week later her symptoms started.  There is been no vomiting, diarrhea.  No known fever or sick contacts.       Past Medical History:  Diagnosis Date   Allergy    Fall Seasonal Allergies   Arthritis of right hip    Cataract    Hyperlipidemia    PONV (postoperative nausea and vomiting)    Special screening for malignant neoplasms, colon    Vaginal prolapse    Varicose veins of legs    has been treated    Patient Active Problem List   Diagnosis Date Noted   Special screening for malignant neoplasms, colon    History of nonmelanoma skin cancer 12/31/2019   Vitamin B 12 deficiency 08/01/2018   Varicose vein of leg 07/11/2018   Colon polyp, hyperplastic 01/28/2017   Arthritis of right hip 01/26/2016   Hyperlipidemia, mild 03/28/2015   Fibrocystic breast 09/17/2014   Environmental and seasonal allergies 09/17/2014   Pelvic prolapse 09/17/2014   Hot flash, menopausal 09/17/2014    Past Surgical History:  Procedure Laterality Date   BREAST BIOPSY Left 1993   neg   CATARACT EXTRACTION W/PHACO Left 08/12/2020   Procedure: CATARACT EXTRACTION PHACO AND INTRAOCULAR LENS PLACEMENT (IOC) LEFT;  Surgeon: Galen Manila, MD;  Location: [facility];  Service: Ophthalmology;  Laterality: Left;  6.89 00:41.2   CATARACT EXTRACTION W/PHACO Right 09/23/2020   Procedure: CATARACT EXTRACTION PHACO AND INTRAOCULAR LENS PLACEMENT (IOC) RIGHT 3.01 00:22.6 ;  Surgeon: Galen Manila, MD;  Location: [facility];  Service:  Ophthalmology;  Laterality: Right;   COLONOSCOPY  12/31/2003   one hyperplastic polyp   COLONOSCOPY WITH PROPOFOL N/A 01/07/2020   Procedure: COLONOSCOPY WITH PROPOFOL;  Surgeon: Midge Minium, MD;  Location: [facility];  Service: Endoscopy;  Laterality: N/A;  priority 4   EXCISION VAGINAL CYST     TOTAL KNEE ARTHROPLASTY Right 07/26/2017    OB History   No obstetric history on file.      Home Medications    Prior to Admission medications   Medication Sig Start Date End Date Taking? Authorizing Provider  amoxicillin-clavulanate (AUGMENTIN) 875-125 MG tablet Take 1 tablet by mouth every 12 (twelve) hours. 12/03/22  Yes Cristalle Rohm, DO  azithromycin (ZITHROMAX Z-PAK) 250 MG tablet Take 2 tablets on day 1 then 1 tablet daily 12/03/22  Yes Yemaya Barnier, DO  cetirizine (ZYRTEC) 10 MG tablet Take 10 mg by mouth daily.   Yes [provider]  Estradiol-Estriol-Progesterone (BIEST/PROGESTERONE) CREA APPLY 1 ML TOPICALLY EVERY DAY OR AS DIRECTED 09/11/20  Yes Reubin Milan, MD  ipratropium (ATROVENT) 0.06 % nasal spray Place 2 sprays into both nostrils 4 (four) times daily. 03/14/22  Yes Rumi Kolodziej, DO  metronidazole (NORITATE) 1 % cream Apply topically daily as needed.   Yes [provider]  Nutritional Supplements (JUICE PLUS FIBRE PO) Take by mouth. Takes: Juice Plus fruits, (2 caps AM), Juice Plus Veggies (2 caps PM), Juice  Plus berries (1 cap AM & PM)   Yes [provider]  Omega 3 1000 MG CAPS Take 1 capsule by mouth 2 (two) times daily. Juice plus brand with 5 omegas   Yes [provider]  albuterol (VENTOLIN HFA) 108 (90 Base) MCG/ACT inhaler Inhale 2 puffs into the lungs every 4 (four) hours as needed for wheezing or shortness of breath. 12/03/22   Katha Cabal, DO    Family History Family History  Problem Relation Age of Onset   Transient ischemic attack Mother    Stroke Mother    Hypertension Father    Stroke Father     Asthma Sister    Lung cancer Brother    Cancer Brother    Varicose Veins Daughter    Breast cancer Neg Hx     Social History Social History   Tobacco Use   Smoking status: Never   Smokeless tobacco: Never  Vaping Use   Vaping status: Never Used  Substance Use Topics   Alcohol use: No    Alcohol/week: 0.0 standard drinks of alcohol   Drug use: No     Allergies   Latex, Other, and Tape   Review of Systems Review of Systems: negative unless otherwise stated in HPI.      Physical Exam Triage Vital Signs ED Triage Vitals  Encounter Vitals Group     BP 12/03/22 1116 138/82     Systolic BP Percentile --      Diastolic BP Percentile --      Pulse Rate 12/03/22 1116 70     Resp --      Temp 12/03/22 1116 98.1 F (36.7 C)     Temp Source 12/03/22 1116 Oral     SpO2 12/03/22 1116 99 %     Weight 12/03/22 1115 168 lb (76.2 kg)     Height 12/03/22 1115 5\' 6"  (1.676 m)     Head Circumference --      Peak Flow --      Pain Score 12/03/22 1114 0     Pain Loc --      Pain Education --      Exclude from Growth Chart --    No data found.  Updated Vital Signs BP 138/82 (BP Location: Left Arm)   Pulse 70   Temp 98.1 F (36.7 C) (Oral)   Ht 5\' 6"  (1.676 m)   Wt 76.2 kg   SpO2 99%   BMI 27.12 kg/m   Visual Acuity Right Eye Distance:   Left Eye Distance:   Bilateral Distance:    Right Eye Near:   Left Eye Near:    Bilateral Near:     Physical Exam GEN:     alert, non-toxic appearing female in no distress    HENT:  mucus membranes moist, no nasal discharge EYES:   pupils equal and reactive, no scleral injection or discharge RESP:  no increased work of breathing, scattered expiratory wheezing, coarse breath sounds on the right CVS:   regular rate and rhythm Skin:   warm and dry    UC Treatments / Results  Labs (all labs ordered are listed, but only abnormal results are displayed) Labs Reviewed  RESP PANEL BY RT-PCR (RSV, FLU A&B, COVID)  [id number]     EKG   Radiology DG Chest 2 View  Result Date: 12/03/2022 CLINICAL DATA:  Productive cough for 5 days. EXAM: CHEST - 2 VIEW COMPARISON:  March 14, 2022. FINDINGS: The heart size and mediastinal  contours are within normal limits. Mild right basilar opacity is noted most consistent with pneumonia. The visualized skeletal structures are unremarkable. IMPRESSION: Mild right basilar opacity is noted most consistent with pneumonia. Followup PA and lateral chest X-ray is recommended in 3-4 weeks following trial of antibiotic therapy to ensure resolution and exclude underlying malignancy. Electronically Signed   By: Lupita Raider M.D.   On: 12/03/2022 12:59    Procedures Procedures (including critical care time)  Medications Ordered in UC Medications - No data to display  Initial Impression / Assessment and Plan / UC Course  I have reviewed the triage vital signs and the nursing notes.  Pertinent labs & imaging results that were available during my care of the patient were reviewed by me and considered in my medical decision making (see chart for details).       Pt is a 77 y.o. female who presents for 5 days of respiratory symptoms. Emelyn is afebrile here. Satting well on room air. Overall pt is non-toxic appearing, well hydrated, without respiratory distress. Pulmonary exam is remarkable.  scattered expiratory wheezing, coarse breath sounds on the right.  RSV, influenza and COVID testing obtained. Pt to quarantine until  test results or longer if positive.  I will call patient with test results, if positive.   CXR obtained and personally reviewed by me showing likely lobar pneumonia on the right.  Treat with dual antibiotics (Augmentin and azithromycin) for 5 days.  Albuterol for wheezing.  Typical duration of symptoms discussed.  Patient aware the radiologist has not read her xray and is comfortable with the preliminary read by me. Will review radiologist read when available and call  patient if a change in plan is warranted.  Pt agreeable to this plan prior to discharge.   Return and ED precautions given and voiced understanding. Discussed MDM, treatment plan and plan for follow-up with patient who agrees with plan.   COVID, influenza and RSV were negative. Radiologist impression reviewed and recommended repeat in 4 weeks.  Pt called and updated on need for repeat CXR in 3-4 weeks.     Final Clinical Impressions(s) / UC Diagnoses   Final diagnoses:  Respiratory illness  Community acquired pneumonia of right lower lobe of lung     Discharge Instructions      On my review of your x-ray it appears that you have a pneumonia on the right side of your lung.  I will call you if the radiologist does not call this a pneumonia or if you have COVID, flu or RSV.  Stop by the pharmacy to pick up your antibiotics.  Take a probiotic/prebiotic or eat some yogurt while taking antibiotics to prevent diarrhea.  See handout on community-acquired pneumonia.     ED Prescriptions     Medication Sig Dispense Auth. Provider   amoxicillin-clavulanate (AUGMENTIN) 875-125 MG tablet Take 1 tablet by mouth every 12 (twelve) hours. 10 tablet Jensyn Shave, DO   azithromycin (ZITHROMAX Z-PAK) 250 MG tablet Take 2 tablets on day 1 then 1 tablet daily 6 tablet Landree Fernholz, DO   albuterol (VENTOLIN HFA) 108 (90 Base) MCG/ACT inhaler Inhale 2 puffs into the lungs every 4 (four) hours as needed for wheezing or shortness of breath. 18 g Katha Cabal, DO      PDMP not reviewed this encounter.   Katha Cabal, DO 12/03/22 1338

## 2022-12-03 NOTE — ED Triage Notes (Signed)
Pt c/o Cough, wheezing, yellow phlegm, body aches x5days  Pt believes she has a "Sinus respiratory infection"  PT received her flu shot on 9.23.24

## 2022-12-03 NOTE — Discharge Instructions (Addendum)
On my review of your x-ray it appears that you have a pneumonia on the right side of your lung.  I will call you if the radiologist does not call this a pneumonia or if you have COVID, flu or RSV.  Stop by the pharmacy to pick up your antibiotics.  Take a probiotic/prebiotic or eat some yogurt while taking antibiotics to prevent diarrhea.  See handout on community-acquired pneumonia.

## 2022-12-15 DIAGNOSIS — M5033 Other cervical disc degeneration, cervicothoracic region: Secondary | ICD-10-CM | POA: Diagnosis not present

## 2022-12-15 DIAGNOSIS — M62830 Muscle spasm of back: Secondary | ICD-10-CM | POA: Diagnosis not present

## 2022-12-15 DIAGNOSIS — M9901 Segmental and somatic dysfunction of cervical region: Secondary | ICD-10-CM | POA: Diagnosis not present

## 2022-12-15 DIAGNOSIS — M9902 Segmental and somatic dysfunction of thoracic region: Secondary | ICD-10-CM | POA: Diagnosis not present

## 2022-12-24 ENCOUNTER — Ambulatory Visit (INDEPENDENT_AMBULATORY_CARE_PROVIDER_SITE_OTHER): Payer: Medicare HMO

## 2022-12-24 ENCOUNTER — Ambulatory Visit
Admission: RE | Admit: 2022-12-24 | Discharge: 2022-12-24 | Disposition: A | Discharge status: Home / Self Care | Payer: Medicare HMO | Source: Ambulatory Visit | Attending: Physician Assistant | Admitting: Physician Assistant

## 2022-12-24 VITALS — BP 157/88 | HR 71 | Temp 98.3°F | Resp 14 | Ht 66.0 in | Wt 168.0 lb

## 2022-12-24 DIAGNOSIS — J189 Pneumonia, unspecified organism: Secondary | ICD-10-CM | POA: Diagnosis not present

## 2022-12-24 DIAGNOSIS — R052 Subacute cough: Secondary | ICD-10-CM

## 2022-12-24 DIAGNOSIS — R059 Cough, unspecified: Secondary | ICD-10-CM | POA: Diagnosis not present

## 2022-12-24 DIAGNOSIS — R918 Other nonspecific abnormal finding of lung field: Secondary | ICD-10-CM | POA: Diagnosis not present

## 2022-12-24 NOTE — ED Provider Notes (Signed)
[facility]    CSN: [id number] Arrival date & time: 12/24/22  7829      History   Chief Complaint Chief Complaint  Patient presents with   Cough    HPI Dana Cameron is a 77 y.o. female.   Patient presents today for a follow-up chest x-ray she was diagnosed with pneumonia on 10 4.  Patient states that she still has a slight cough denies any fever no nausea vomiting diarrhea.  She has been taking over-the-counter cough and flu medication as needed.    Past Medical History:  Diagnosis Date   Allergy    Fall Seasonal Allergies   Arthritis of right hip    Cataract    Hyperlipidemia    PONV (postoperative nausea and vomiting)    Special screening for malignant neoplasms, colon    Vaginal prolapse    Varicose veins of legs    has been treated    Patient Active Problem List   Diagnosis Date Noted   Special screening for malignant neoplasms, colon    History of nonmelanoma skin cancer 12/31/2019   Vitamin B 12 deficiency 08/01/2018   Varicose vein of leg 07/11/2018   Colon polyp, hyperplastic 01/28/2017   Arthritis of right hip 01/26/2016   Hyperlipidemia, mild 03/28/2015   Fibrocystic breast 09/17/2014   Environmental and seasonal allergies 09/17/2014   Pelvic prolapse 09/17/2014   Hot flash, menopausal 09/17/2014    Past Surgical History:  Procedure Laterality Date   BREAST BIOPSY Left 1993   neg   CATARACT EXTRACTION W/PHACO Left 08/12/2020   Procedure: CATARACT EXTRACTION PHACO AND INTRAOCULAR LENS PLACEMENT (IOC) LEFT;  Surgeon: Galen Manila, MD;  Location: [facility];  Service: Ophthalmology;  Laterality: Left;  6.89 00:41.2   CATARACT EXTRACTION W/PHACO Right 09/23/2020   Procedure: CATARACT EXTRACTION PHACO AND INTRAOCULAR LENS PLACEMENT (IOC) RIGHT 3.01 00:22.6 ;  Surgeon: Galen Manila, MD;  Location: [facility];  Service: Ophthalmology;  Laterality: Right;   COLONOSCOPY  12/31/2003   one hyperplastic polyp    COLONOSCOPY WITH PROPOFOL N/A 01/07/2020   Procedure: COLONOSCOPY WITH PROPOFOL;  Surgeon: Midge Minium, MD;  Location: [facility];  Service: Endoscopy;  Laterality: N/A;  priority 4   EXCISION VAGINAL CYST     TOTAL KNEE ARTHROPLASTY Right 07/26/2017    OB History   No obstetric history on file.      Home Medications    Prior to Admission medications   Medication Sig Start Date End Date Taking? Authorizing Provider  albuterol (VENTOLIN HFA) 108 (90 Base) MCG/ACT inhaler Inhale 2 puffs into the lungs every 4 (four) hours as needed for wheezing or shortness of breath. 12/03/22   Brimage, Seward Meth, DO  amoxicillin-clavulanate (AUGMENTIN) 875-125 MG tablet Take 1 tablet by mouth every 12 (twelve) hours. 12/03/22   Brimage, Seward Meth, DO  azithromycin (ZITHROMAX Z-PAK) 250 MG tablet Take 2 tablets on day 1 then 1 tablet daily 12/03/22   Katha Cabal, DO  cetirizine (ZYRTEC) 10 MG tablet Take 10 mg by mouth daily.    [provider]  Estradiol-Estriol-Progesterone (BIEST/PROGESTERONE) CREA APPLY 1 ML TOPICALLY EVERY DAY OR AS DIRECTED 09/11/20   Reubin Milan, MD  ipratropium (ATROVENT) 0.06 % nasal spray Place 2 sprays into both nostrils 4 (four) times daily. 03/14/22   Katha Cabal, DO  metronidazole (NORITATE) 1 % cream Apply topically daily as needed.    [provider]  Nutritional Supplements (JUICE PLUS FIBRE PO) Take by mouth. Takes: Juice  Plus fruits, (2 caps AM), Juice Plus Veggies (2 caps PM), Juice Plus berries (1 cap AM & PM)    [provider]  Omega 3 1000 MG CAPS Take 1 capsule by mouth 2 (two) times daily. Juice plus brand with 5 omegas    [provider]    Family History Family History  Problem Relation Age of Onset   Transient ischemic attack Mother    Stroke Mother    Hypertension Father    Stroke Father    Asthma Sister    Lung cancer Brother    Cancer Brother    Varicose Veins Daughter    Breast cancer Neg Hx      Social History Social History   Tobacco Use   Smoking status: Never   Smokeless tobacco: Never  Vaping Use   Vaping status: Never Used  Substance Use Topics   Alcohol use: No    Alcohol/week: 0.0 standard drinks of alcohol   Drug use: No     Allergies   Latex, Other, and Tape   Review of Systems Review of Systems  Constitutional:  Negative for fever.  HENT: Negative.    Eyes: Negative.   Respiratory:  Positive for cough. Negative for shortness of breath and wheezing.   Cardiovascular: Negative.   Gastrointestinal: Negative.   Genitourinary: Negative.   Neurological: Negative.      Physical Exam Triage Vital Signs ED Triage Vitals  Encounter Vitals Group     BP 12/24/22 0820 (!) 157/88     Systolic BP Percentile --      Diastolic BP Percentile --      Pulse Rate 12/24/22 0820 71     Resp 12/24/22 0820 14     Temp 12/24/22 0820 98.3 F (36.8 C)     Temp Source 12/24/22 0820 Oral     SpO2 12/24/22 0820 95 %     Weight 12/24/22 0819 167 lb 15.9 oz (76.2 kg)     Height 12/24/22 0819 5\' 6"  (1.676 m)     Head Circumference --      Peak Flow --      Pain Score 12/24/22 0819 0     Pain Loc --      Pain Education --      Exclude from Growth Chart --    No data found.  Updated Vital Signs BP (!) 157/88 (BP Location: Right Arm)   Pulse 71   Temp 98.3 F (36.8 C) (Oral)   Resp 14   Ht 5\' 6"  (1.676 m)   Wt 167 lb 15.9 oz (76.2 kg)   SpO2 95%   BMI 27.11 kg/m   Visual Acuity Right Eye Distance:   Left Eye Distance:   Bilateral Distance:    Right Eye Near:   Left Eye Near:    Bilateral Near:     Physical Exam Constitutional:      Appearance: Normal appearance.  Cardiovascular:     Rate and Rhythm: Normal rate.  Pulmonary:     Effort: Pulmonary effort is normal.     Breath sounds: Normal breath sounds. No wheezing or rhonchi.  Abdominal:     General: Abdomen is flat.  Skin:    General: Skin is warm.  Neurological:     General: No focal  deficit present.     Mental Status: She is alert.      UC Treatments / Results  Labs (all labs ordered are listed, but only abnormal results are displayed) Labs  Reviewed - No data to display  EKG   Radiology DG Chest 2 View  Result Date: 12/24/2022 CLINICAL DATA:  One-month history of cough EXAM: CHEST - 2 VIEW COMPARISON:  Chest radiograph dated 12/03/2022 FINDINGS: Normal lung volumes. Confluent right lower lobe opacity is no longer seen. No new focal consolidations. No pleural effusion or pneumothorax. The heart size and mediastinal contours are within normal limits. No acute osseous abnormality. IMPRESSION: Confluent right lower lobe opacity is no longer seen, likely resolved pneumonia. No new focal consolidations. Electronically Signed   By: Agustin Cree M.D.   On: 12/24/2022 08:39    Procedures Procedures (including critical care time)  Medications Ordered in UC Medications - No data to display  Initial Impression / Assessment and Plan / UC Course  I have reviewed the triage vital signs and the nursing notes.  Pertinent labs & imaging results that were available during my care of the patient were reviewed by me and considered in my medical decision making (see chart for details).     Chest x-ray negative today pneumonia has resolved.  Cough may linger for several weeks take over-the-counter cough medication without decongestant as this may elevate your blood pressure.  Follow-up with your primary care in 2 to 4 weeks.  Use your albuterol inhaler that was given on your previous visit as needed  Final Clinical Impressions(s) / UC Diagnoses   Final diagnoses:  Subacute cough     Discharge Instructions      Chest x-ray negative today pneumonia has resolved.  Cough may linger for several weeks take over-the-counter cough medication without decongestant as this may elevate your blood pressure.  Follow-up with your primary care in 2 to 4 weeks.  Use your albuterol inhaler  that was given on your previous visit as needed     ED Prescriptions   None    PDMP not reviewed this encounter.   Coralyn Mark, NP 12/24/22 [phone]

## 2022-12-24 NOTE — ED Triage Notes (Signed)
Patient states that she has had this ongoing cough for a month.  Patient states that she was told to come back in 3 weeks for a repeat chest x-ray.  Patient denies recent fevers.

## 2022-12-24 NOTE — Discharge Instructions (Signed)
Chest x-ray negative today pneumonia has resolved.  Cough may linger for several weeks take over-the-counter cough medication without decongestant as this may elevate your blood pressure.  Follow-up with your primary care in 2 to 4 weeks.  Use your albuterol inhaler that was given on your previous visit as needed

## 2023-01-12 DIAGNOSIS — M9901 Segmental and somatic dysfunction of cervical region: Secondary | ICD-10-CM | POA: Diagnosis not present

## 2023-01-12 DIAGNOSIS — M62830 Muscle spasm of back: Secondary | ICD-10-CM | POA: Diagnosis not present

## 2023-01-12 DIAGNOSIS — M5033 Other cervical disc degeneration, cervicothoracic region: Secondary | ICD-10-CM | POA: Diagnosis not present

## 2023-01-12 DIAGNOSIS — M9902 Segmental and somatic dysfunction of thoracic region: Secondary | ICD-10-CM | POA: Diagnosis not present

## 2023-02-08 DIAGNOSIS — M5033 Other cervical disc degeneration, cervicothoracic region: Secondary | ICD-10-CM | POA: Diagnosis not present

## 2023-02-08 DIAGNOSIS — M62830 Muscle spasm of back: Secondary | ICD-10-CM | POA: Diagnosis not present

## 2023-02-08 DIAGNOSIS — M9901 Segmental and somatic dysfunction of cervical region: Secondary | ICD-10-CM | POA: Diagnosis not present

## 2023-02-08 DIAGNOSIS — M9902 Segmental and somatic dysfunction of thoracic region: Secondary | ICD-10-CM | POA: Diagnosis not present

## 2023-02-08 DIAGNOSIS — I83813 Varicose veins of bilateral lower extremities with pain: Secondary | ICD-10-CM | POA: Diagnosis not present

## 2023-03-08 DIAGNOSIS — I83812 Varicose veins of left lower extremities with pain: Secondary | ICD-10-CM | POA: Diagnosis not present

## 2023-03-08 DIAGNOSIS — M7989 Other specified soft tissue disorders: Secondary | ICD-10-CM | POA: Diagnosis not present

## 2023-03-09 DIAGNOSIS — M9902 Segmental and somatic dysfunction of thoracic region: Secondary | ICD-10-CM | POA: Diagnosis not present

## 2023-03-09 DIAGNOSIS — M9901 Segmental and somatic dysfunction of cervical region: Secondary | ICD-10-CM | POA: Diagnosis not present

## 2023-03-09 DIAGNOSIS — M62830 Muscle spasm of back: Secondary | ICD-10-CM | POA: Diagnosis not present

## 2023-03-09 DIAGNOSIS — M5033 Other cervical disc degeneration, cervicothoracic region: Secondary | ICD-10-CM | POA: Diagnosis not present

## 2023-03-11 DIAGNOSIS — I83811 Varicose veins of right lower extremities with pain: Secondary | ICD-10-CM | POA: Diagnosis not present

## 2023-04-06 DIAGNOSIS — M9901 Segmental and somatic dysfunction of cervical region: Secondary | ICD-10-CM | POA: Diagnosis not present

## 2023-04-06 DIAGNOSIS — M9902 Segmental and somatic dysfunction of thoracic region: Secondary | ICD-10-CM | POA: Diagnosis not present

## 2023-04-06 DIAGNOSIS — M5033 Other cervical disc degeneration, cervicothoracic region: Secondary | ICD-10-CM | POA: Diagnosis not present

## 2023-04-06 DIAGNOSIS — M62830 Muscle spasm of back: Secondary | ICD-10-CM | POA: Diagnosis not present

## 2023-06-28 ENCOUNTER — Ambulatory Visit (INDEPENDENT_AMBULATORY_CARE_PROVIDER_SITE_OTHER): Admitting: Internal Medicine

## 2023-06-28 VITALS — BP 118/74 | HR 56 | Ht 66.0 in | Wt 173.1 lb

## 2023-06-28 DIAGNOSIS — E785 Hyperlipidemia, unspecified: Secondary | ICD-10-CM

## 2023-06-28 DIAGNOSIS — I878 Other specified disorders of veins: Secondary | ICD-10-CM | POA: Diagnosis not present

## 2023-06-28 DIAGNOSIS — Z1231 Encounter for screening mammogram for malignant neoplasm of breast: Secondary | ICD-10-CM | POA: Diagnosis not present

## 2023-06-28 DIAGNOSIS — J3089 Other allergic rhinitis: Secondary | ICD-10-CM

## 2023-06-28 DIAGNOSIS — E538 Deficiency of other specified B group vitamins: Secondary | ICD-10-CM

## 2023-06-28 DIAGNOSIS — M8580 Other specified disorders of bone density and structure, unspecified site: Secondary | ICD-10-CM

## 2023-06-28 DIAGNOSIS — Z0000 Encounter for general adult medical examination without abnormal findings: Secondary | ICD-10-CM

## 2023-06-28 MED ORDER — SHINGRIX 50 MCG/0.5ML IM SUSR: 0.5000 mL | Freq: Once | INTRAMUSCULAR | 1 refills | Status: AC

## 2023-06-28 NOTE — Assessment & Plan Note (Signed)
 No longer on supplements Will obtain labs and advise

## 2023-06-28 NOTE — Assessment & Plan Note (Signed)
 Last DEXA 2020 showed osteopenia. Recommend daily vitamin D ; calcium if tolerated Repeat DEXA and if stable, no further exams needed

## 2023-06-28 NOTE — Assessment & Plan Note (Signed)
 Followed by [facility] and has vein injections as needed. Chronic skin changes without ulceration noted.

## 2023-06-28 NOTE — Assessment & Plan Note (Signed)
 Continue healthy diet, exercise. Lab Results  Component Value Date   LDLCALC 84 01/30/2018

## 2023-06-28 NOTE — Assessment & Plan Note (Signed)
 Uses albuterol  PRN only

## 2023-06-28 NOTE — Patient Instructions (Addendum)
 Call [facility] to schedule your mammogram and Bone density at [phone].  You need at least 25 mg of vitamin D .  Biotin is good for hair and nails.

## 2023-06-28 NOTE — Progress Notes (Signed)
 Date:  06/28/2023   Name:  Dana Cameron   DOB:  1945-08-08   MRN:  [id number]   Chief Complaint: Annual Exam Dana Cameron is a 78 y.o. female who presents today for her Complete Annual Exam. She feels well. She reports exercising none. She reports she is sleeping well. Breast complaints none.  Health Maintenance  Topic Date Due   Zoster (Shingles) Vaccine (1 of 2) 10/31/1965   DTaP/Tdap/Td vaccine (2 - Td or Tdap) 08/26/2019   Medicare Annual Wellness Visit  02/18/2023   COVID-19 Vaccine (5 - 2024-25 season) 07/14/2023*   Flu Shot  09/30/2023   Mammogram  11/04/2023   Pneumonia Vaccine  Completed   DEXA scan (bone density measurement)  Completed   Hepatitis C Screening  Addressed   HPV Vaccine  Aged Out   Meningitis B Vaccine  Aged Out   Colon Cancer Screening  Discontinued  *Topic was postponed. The date shown is not the original due date.    HPI  Review of Systems  Constitutional:  Negative for fatigue and unexpected weight change.  HENT:  Negative for trouble swallowing.   Eyes:  Negative for visual disturbance.  Respiratory:  Negative for cough, chest tightness, shortness of breath and wheezing.   Cardiovascular:  Negative for chest pain, palpitations and leg swelling.  Gastrointestinal:  Negative for abdominal pain, constipation and diarrhea.  Musculoskeletal:  Positive for arthralgias. Negative for myalgias.  Neurological:  Negative for dizziness, weakness, light-headedness and headaches.     Lab Results  Component Value Date   NA 144 07/04/2018   K 4.2 07/04/2018   CO2 24 01/30/2018   GLUCOSE 85 01/30/2018   BUN 15 07/04/2018   CREATININE 0.9 07/04/2018   CALCIUM 9.3 01/30/2018   GFRNONAA 60 01/30/2018   Lab Results  Component Value Date   CHOL 170 01/30/2018   HDL 58 01/30/2018   LDLCALC 84 01/30/2018   TRIG 140 01/30/2018   CHOLHDL 2.9 01/30/2018   Lab Results  Component Value Date   TSH 2.850 01/30/2018   No results found for:  "HGBA1C" Lab Results  Component Value Date   WBC 4.2 07/04/2018   HGB 14.6 07/04/2018   HCT 43 07/04/2018   MCV 93 01/30/2018   PLT 268 07/04/2018   Lab Results  Component Value Date   ALT 12 01/30/2018   AST 15 01/30/2018   ALKPHOS 85 01/30/2018   BILITOT 0.5 01/30/2018   Lab Results  Component Value Date   VD25OH 52.4 07/04/2018     Patient Active Problem List   Diagnosis Date Noted   Osteopenia determined by x-ray 06/28/2023   Venous stasis 06/28/2023   Special screening for malignant neoplasms, colon    History of nonmelanoma skin cancer 12/31/2019   Vitamin B 12 deficiency 08/01/2018   Varicose vein of leg 07/11/2018   Colon polyp, hyperplastic 01/28/2017   Arthritis of right hip 01/26/2016   Hyperlipidemia, mild 03/28/2015   Fibrocystic breast 09/17/2014   Environmental and seasonal allergies 09/17/2014   Pelvic prolapse 09/17/2014   Hot flash, menopausal 09/17/2014    Allergies  Allergen Reactions   Latex Itching   Other     Neosporin ophthalmic caused red, burning eyes   Tape     bandaids cause red skin    Past Surgical History:  Procedure Laterality Date   BREAST BIOPSY Left 1993   neg   CATARACT EXTRACTION W/PHACO Left 08/12/2020   Procedure: CATARACT EXTRACTION PHACO AND INTRAOCULAR  LENS PLACEMENT (IOC) LEFT;  Surgeon: Clair Crews, MD;  Location: [facility];  Service: Ophthalmology;  Laterality: Left;  6.89 00:41.2   CATARACT EXTRACTION W/PHACO Right 09/23/2020   Procedure: CATARACT EXTRACTION PHACO AND INTRAOCULAR LENS PLACEMENT (IOC) RIGHT 3.01 00:22.6 ;  Surgeon: Clair Crews, MD;  Location: [facility];  Service: Ophthalmology;  Laterality: Right;   COLONOSCOPY  12/31/2003   one hyperplastic polyp   COLONOSCOPY WITH PROPOFOL  N/A 01/07/2020   Procedure: COLONOSCOPY WITH PROPOFOL ;  Surgeon: Marnee Sink, MD;  Location: [facility];  Service: Endoscopy;  Laterality: N/A;  priority 4   EXCISION VAGINAL CYST      TOTAL KNEE ARTHROPLASTY Right 07/26/2017    Social History   Tobacco Use   Smoking status: Never   Smokeless tobacco: Never  Vaping Use   Vaping status: Never Used  Substance Use Topics   Alcohol use: No    Alcohol/week: 0.0 standard drinks of alcohol   Drug use: No     Medication list has been reviewed and updated.  Current Meds  Medication Sig   albuterol  (VENTOLIN  HFA) 108 (90 Base) MCG/ACT inhaler Inhale 2 puffs into the lungs every 4 (four) hours as needed for wheezing or shortness of breath.   cetirizine (ZYRTEC) 10 MG tablet Take 10 mg by mouth as needed.   ipratropium (ATROVENT ) 0.06 % nasal spray Place 2 sprays into both nostrils 4 (four) times daily.   Nutritional Supplements (JUICE PLUS FIBRE PO) Take by mouth. Takes: Juice Plus fruits, (2 caps AM), Juice Plus Veggies (2 caps PM), Juice Plus berries (1 cap AM & PM)   Omega 3 1000 MG CAPS Take 1 capsule by mouth 2 (two) times daily. Juice plus brand with 5 omegas   Zoster Vaccine Adjuvanted [facility]) injection Inject 0.5 mLs into the muscle once for 1 dose.       06/28/2023    2:42 PM 01/07/2021    8:54 AM 08/23/2019    1:50 PM  GAD 7 : Generalized Anxiety Score  Nervous, Anxious, on Edge 0 0 0  Control/stop worrying 0 0 0  Worry too much - different things 0 0 0  Trouble relaxing 0 0 0  Restless 0 0 0  Easily annoyed or irritable 0 0 0  Afraid - awful might happen 0 0 0  Total GAD 7 Score 0 0 0  Anxiety Difficulty Not difficult at all Not difficult at all Not difficult at all       06/28/2023    2:41 PM 02/17/2022    8:37 AM 02/16/2021    8:56 AM  Depression screen PHQ 2/9  Decreased Interest 0 0 0  Down, Depressed, Hopeless 0 0 0  PHQ - 2 Score 0 0 0  Altered sleeping 0    Tired, decreased energy 0    Change in appetite 0    Feeling bad or failure about yourself  0    Trouble concentrating 1    Moving slowly or fidgety/restless 0    Suicidal thoughts 0    PHQ-9 Score 1    Difficult doing  work/chores Not difficult at all      BP Readings from Last 3 Encounters:  06/28/23 118/74  12/24/22 (!) 157/88  12/03/22 138/82    Physical Exam Vitals and nursing note reviewed.  Constitutional:      General: She is not in acute distress.    Appearance: She is well-developed.  HENT:     Head: Normocephalic and  atraumatic.     Right Ear: Tympanic membrane and ear canal normal.     Left Ear: Tympanic membrane and ear canal normal.     Nose:     Right Sinus: No maxillary sinus tenderness.     Left Sinus: No maxillary sinus tenderness.  Eyes:     General: No scleral icterus.       Right eye: No discharge.        Left eye: No discharge.     Conjunctiva/sclera: Conjunctivae normal.  Neck:     Thyroid : No thyromegaly.     Vascular: No carotid bruit.  Cardiovascular:     Rate and Rhythm: Normal rate and regular rhythm.     Pulses: Normal pulses.     Heart sounds: Normal heart sounds.  Pulmonary:     Effort: Pulmonary effort is normal. No respiratory distress.     Breath sounds: No wheezing.  Abdominal:     General: Bowel sounds are normal.     Palpations: Abdomen is soft.     Tenderness: There is no abdominal tenderness.  Musculoskeletal:     Cervical back: Normal range of motion. No erythema.     Right lower leg: No edema.     Left lower leg: No edema.  Lymphadenopathy:     Cervical: No cervical adenopathy.  Skin:    General: Skin is warm and dry.     Coloration: Skin is mottled.     Findings: No rash.     Comments: Chronic skin changes at both ankles with spider veins  No ulceration or weeping noted; non tender  Neurological:     Mental Status: She is alert and oriented to person, place, and time.     Cranial Nerves: No cranial nerve deficit.     Sensory: No sensory deficit.     Deep Tendon Reflexes: Reflexes are normal and symmetric.  Psychiatric:        Attention and Perception: Attention normal.        Mood and Affect: Mood normal.     Wt Readings from  Last 3 Encounters:  06/28/23 173 lb 2 oz (78.5 kg)  12/24/22 167 lb 15.9 oz (76.2 kg)  12/03/22 168 lb (76.2 kg)    BP 118/74   Pulse (!) 56   Ht 5\' 6"  (1.676 m)   Wt 173 lb 2 oz (78.5 kg)   SpO2 98%   BMI 27.94 kg/m   Assessment and Plan:  Problem List Items Addressed This Visit       Unprioritized   Environmental and seasonal allergies   Uses albuterol  PRN only      Hyperlipidemia, mild   Continue healthy diet, exercise. Lab Results  Component Value Date   LDLCALC 84 01/30/2018         Relevant Orders   Lipid panel   Vitamin B 12 deficiency   No longer on supplements Will obtain labs and advise      Relevant Orders   CBC with Differential/Platelet   Vitamin B12   Osteopenia determined by x-ray   Last DEXA 2020 showed osteopenia. Recommend daily vitamin D ; calcium if tolerated Repeat DEXA and if stable, no further exams needed      Relevant Orders   DG Bone Density   Venous stasis   Followed by [facility] and has vein injections as needed. Chronic skin changes without ulceration noted.      Relevant Orders   Comprehensive metabolic panel with GFR   TSH  Other Visit Diagnoses       Annual physical exam    -  Primary   continue healthy diet; up to date on screenings Mammo and DEXA due Shingrix vaccine Rx provided     Encounter for screening mammogram for breast cancer       Relevant Orders   MM 3D SCREENING MAMMOGRAM BILATERAL BREAST       No follow-ups on file.    Sheron Dixons, MD [facility] [place]

## 2023-06-29 ENCOUNTER — Encounter: Payer: Self-pay | Admitting: Internal Medicine

## 2023-06-29 LAB — CBC WITH DIFFERENTIAL/PLATELET
Basophils Absolute: 0 10*3/uL (ref 0.0–0.2)
Basos: 1 %
EOS (ABSOLUTE): 0.1 10*3/uL (ref 0.0–0.4)
Eos: 1 %
Hematocrit: 39 % (ref 34.0–46.6)
Hemoglobin: 12.8 g/dL (ref 11.1–15.9)
Immature Grans (Abs): 0 10*3/uL (ref 0.0–0.1)
Immature Granulocytes: 0 %
Lymphocytes Absolute: 1.8 10*3/uL (ref 0.7–3.1)
Lymphs: 34 %
MCH: 31 pg (ref 26.6–33.0)
MCHC: 32.8 g/dL (ref 31.5–35.7)
MCV: 94 fL (ref 79–97)
Monocytes Absolute: 0.4 10*3/uL (ref 0.1–0.9)
Monocytes: 8 %
Neutrophils Absolute: 3 10*3/uL (ref 1.4–7.0)
Neutrophils: 56 %
Platelets: 276 10*3/uL (ref 150–450)
RBC: 4.13 x10E6/uL (ref 3.77–5.28)
RDW: 12.6 % (ref 11.7–15.4)
WBC: 5.3 10*3/uL (ref 3.4–10.8)

## 2023-06-29 LAB — LIPID PANEL
Chol/HDL Ratio: 2.7 ratio (ref 0.0–4.4)
Cholesterol, Total: 149 mg/dL (ref 100–199)
HDL: 56 mg/dL (ref >39)
LDL Chol Calc (NIH): 75 mg/dL (ref 0–99)
Triglycerides: 97 mg/dL (ref 0–149)
VLDL Cholesterol Cal: 18 mg/dL (ref 5–40)

## 2023-06-29 LAB — COMPREHENSIVE METABOLIC PANEL WITH GFR
ALT: 12 IU/L (ref 0–32)
AST: 18 IU/L (ref 0–40)
Albumin: 4.3 g/dL (ref 3.8–4.8)
Alkaline Phosphatase: 98 IU/L (ref 44–121)
BUN/Creatinine Ratio: 14 (ref 12–28)
BUN: 12 mg/dL (ref 8–27)
Bilirubin Total: 0.4 mg/dL (ref 0.0–1.2)
CO2: 23 mmol/L (ref 20–29)
Calcium: 9.1 mg/dL (ref 8.7–10.3)
Chloride: 105 mmol/L (ref 96–106)
Creatinine, Ser: 0.85 mg/dL (ref 0.57–1.00)
Globulin, Total: 2.1 g/dL (ref 1.5–4.5)
Glucose: 80 mg/dL (ref 70–99)
Potassium: 4.3 mmol/L (ref 3.5–5.2)
Sodium: 141 mmol/L (ref 134–144)
Total Protein: 6.4 g/dL (ref 6.0–8.5)
eGFR: 71 mL/min/{1.73_m2} (ref >59)

## 2023-06-29 LAB — VITAMIN B12: Vitamin B-12: 162 pg/mL — ABNORMAL LOW (ref 232–1245)

## 2023-06-29 LAB — TSH: TSH: 2.09 u[IU]/mL (ref 0.450–4.500)

## 2023-10-26 DIAGNOSIS — M62830 Muscle spasm of back: Secondary | ICD-10-CM | POA: Diagnosis not present

## 2023-10-26 DIAGNOSIS — M5033 Other cervical disc degeneration, cervicothoracic region: Secondary | ICD-10-CM | POA: Diagnosis not present

## 2023-10-26 DIAGNOSIS — M9901 Segmental and somatic dysfunction of cervical region: Secondary | ICD-10-CM | POA: Diagnosis not present

## 2023-10-26 DIAGNOSIS — M9902 Segmental and somatic dysfunction of thoracic region: Secondary | ICD-10-CM | POA: Diagnosis not present

## 2023-11-09 ENCOUNTER — Ambulatory Visit
Admission: RE | Admit: 2023-11-09 | Discharge: 2023-11-09 | Disposition: A | Discharge status: Home / Self Care | Source: Ambulatory Visit | Attending: Internal Medicine | Admitting: Internal Medicine

## 2023-11-09 DIAGNOSIS — Z1231 Encounter for screening mammogram for malignant neoplasm of breast: Secondary | ICD-10-CM | POA: Insufficient documentation

## 2023-11-09 DIAGNOSIS — Z13820 Encounter for screening for osteoporosis: Secondary | ICD-10-CM | POA: Insufficient documentation

## 2023-11-09 DIAGNOSIS — M8589 Other specified disorders of bone density and structure, multiple sites: Secondary | ICD-10-CM | POA: Diagnosis not present

## 2023-11-09 DIAGNOSIS — Z780 Asymptomatic menopausal state: Secondary | ICD-10-CM | POA: Diagnosis not present

## 2023-11-09 DIAGNOSIS — M8580 Other specified disorders of bone density and structure, unspecified site: Secondary | ICD-10-CM | POA: Diagnosis present

## 2023-11-09 DIAGNOSIS — M8588 Other specified disorders of bone density and structure, other site: Secondary | ICD-10-CM | POA: Diagnosis not present

## 2023-11-10 ENCOUNTER — Ambulatory Visit: Payer: Self-pay | Admitting: Internal Medicine

## 2023-11-23 DIAGNOSIS — M9902 Segmental and somatic dysfunction of thoracic region: Secondary | ICD-10-CM | POA: Diagnosis not present

## 2023-11-23 DIAGNOSIS — M62830 Muscle spasm of back: Secondary | ICD-10-CM | POA: Diagnosis not present

## 2023-11-23 DIAGNOSIS — M5033 Other cervical disc degeneration, cervicothoracic region: Secondary | ICD-10-CM | POA: Diagnosis not present

## 2023-11-23 DIAGNOSIS — M9901 Segmental and somatic dysfunction of cervical region: Secondary | ICD-10-CM | POA: Diagnosis not present

## 2023-12-21 DIAGNOSIS — M5033 Other cervical disc degeneration, cervicothoracic region: Secondary | ICD-10-CM | POA: Diagnosis not present

## 2023-12-21 DIAGNOSIS — M62830 Muscle spasm of back: Secondary | ICD-10-CM | POA: Diagnosis not present

## 2023-12-21 DIAGNOSIS — M9902 Segmental and somatic dysfunction of thoracic region: Secondary | ICD-10-CM | POA: Diagnosis not present

## 2023-12-21 DIAGNOSIS — M9901 Segmental and somatic dysfunction of cervical region: Secondary | ICD-10-CM | POA: Diagnosis not present

## 2024-01-09 DIAGNOSIS — H524 Presbyopia: Secondary | ICD-10-CM | POA: Diagnosis not present

## 2024-01-18 DIAGNOSIS — M9901 Segmental and somatic dysfunction of cervical region: Secondary | ICD-10-CM | POA: Diagnosis not present

## 2024-01-18 DIAGNOSIS — M62830 Muscle spasm of back: Secondary | ICD-10-CM | POA: Diagnosis not present

## 2024-01-18 DIAGNOSIS — M9902 Segmental and somatic dysfunction of thoracic region: Secondary | ICD-10-CM | POA: Diagnosis not present

## 2024-01-18 DIAGNOSIS — M5033 Other cervical disc degeneration, cervicothoracic region: Secondary | ICD-10-CM | POA: Diagnosis not present

## 2024-01-25 ENCOUNTER — Ambulatory Visit

## 2024-01-25 DIAGNOSIS — Z0000 Encounter for general adult medical examination without abnormal findings: Secondary | ICD-10-CM

## 2024-01-25 NOTE — Progress Notes (Deleted)
 No chief complaint on file.    Subjective:   Dana Cameron is a 78 y.o. female who presents for a Medicare Annual Wellness Visit.  Allergies (verified) Latex, Other, and Tape   History: Past Medical History:  Diagnosis Date   Allergy    Fall Seasonal Allergies   Arthritis of right hip    Cataract    Hyperlipidemia    PONV (postoperative nausea and vomiting)    Special screening for malignant neoplasms, colon    Vaginal prolapse    Varicose veins of legs    has been treated   Past Surgical History:  Procedure Laterality Date   BREAST BIOPSY Left 1993   neg   CATARACT EXTRACTION W/PHACO Left 08/12/2020   Procedure: CATARACT EXTRACTION PHACO AND INTRAOCULAR LENS PLACEMENT (IOC) LEFT;  Surgeon: Jaye Fallow, MD;  Location: [facility];  Service: Ophthalmology;  Laterality: Left;  6.89 00:41.2   CATARACT EXTRACTION W/PHACO Right 09/23/2020   Procedure: CATARACT EXTRACTION PHACO AND INTRAOCULAR LENS PLACEMENT (IOC) RIGHT 3.01 00:22.6 ;  Surgeon: Jaye Fallow, MD;  Location: [facility];  Service: Ophthalmology;  Laterality: Right;   COLONOSCOPY  12/31/2003   one hyperplastic polyp   COLONOSCOPY WITH PROPOFOL  N/A 01/07/2020   Procedure: COLONOSCOPY WITH PROPOFOL ;  Surgeon: Jinny Carmine, MD;  Location: [facility];  Service: Endoscopy;  Laterality: N/A;  priority 4   EXCISION VAGINAL CYST     TOTAL KNEE ARTHROPLASTY Right 07/26/2017   Family History  Problem Relation Age of Onset   Transient ischemic attack Mother    Stroke Mother    Hypertension Father    Stroke Father    Asthma Sister    Lung cancer Brother    Cancer Brother    Varicose Veins Daughter    Breast cancer Neg Hx    Social History   Occupational History   Occupation: retired  Tobacco Use   Smoking status: Never   Smokeless tobacco: Never  Vaping Use   Vaping status: Never Used  Substance and Sexual Activity   Alcohol use: No    Alcohol/week: 0.0 standard  drinks of alcohol   Drug use: No   Sexual activity: Not Currently    Birth control/protection: None   Tobacco Counseling Counseling given: Not Answered  SDOH Screenings   Food Insecurity: No Food Insecurity (06/28/2023)  Housing: Low Risk  (06/28/2023)  Transportation Needs: No Transportation Needs (06/28/2023)  Utilities: Not At Risk (02/17/2022)  Alcohol Screen: Low Risk  (02/17/2022)  Depression (PHQ2-9): Low Risk  (06/28/2023)  Financial Resource Strain: Low Risk  (06/28/2023)  Physical Activity: Inactive (02/17/2022)  Social Connections: Socially Integrated (06/28/2023)  Stress: No Stress Concern Present (06/28/2023)  Tobacco Use: Low Risk  (06/28/2023)   See flowsheets for full screening details  Depression Screen PHQ 2 & 9 Depression Scale- Over the past 2 weeks, how often have you been bothered by any of the following problems? Little interest or pleasure in doing things: 0 Feeling down, depressed, or hopeless (PHQ Adolescent also includes...irritable): 0 PHQ-2 Total Score: 0 Trouble falling or staying asleep, or sleeping too much: 0 Feeling tired or having little energy: 0 Poor appetite or overeating (PHQ Adolescent also includes...weight loss): 0 Feeling bad about yourself - or that you are a failure or have let yourself or your family down: 0 Trouble concentrating on things, such as reading the newspaper or watching television (PHQ Adolescent also includes...like school work): 1 Moving or speaking so slowly that other people could  have noticed. Or the opposite - being so fidgety or restless that you have been moving around a lot more than usual: 0 Thoughts that you would be better off dead, or of hurting yourself in some way: 0 PHQ-9 Total Score: 1 If you checked off any problems, how difficult have these problems made it for you to do your work, take care of things at home, or get along with other people?: Not difficult at all     Goals Addressed   None    Fall  Screening Falls in the past year?: 0 Number of falls in past year: 0 Was there an injury with Fall?: 0 Fall Risk Category Calculator: 0 Patient Fall Risk Level: Low Fall Risk  Fall Risk Patient at Risk for Falls Due to: No Fall Risks Fall risk Follow up: Falls evaluation completed        Objective:    There were no vitals filed for this visit. There is no height or weight on file to calculate BMI.  Current Medications (verified) Outpatient Encounter Medications as of 01/25/2024  Medication Sig   FLUZONE HIGH-DOSE 0.5 ML injection    albuterol  (VENTOLIN  HFA) 108 (90 Base) MCG/ACT inhaler Inhale 2 puffs into the lungs every 4 (four) hours as needed for wheezing or shortness of breath.   cetirizine (ZYRTEC) 10 MG tablet Take 10 mg by mouth as needed.   ipratropium (ATROVENT ) 0.06 % nasal spray Place 2 sprays into both nostrils 4 (four) times daily.   Nutritional Supplements (JUICE PLUS FIBRE PO) Take by mouth. Takes: Juice Plus fruits, (2 caps AM), Juice Plus Veggies (2 caps PM), Juice Plus berries (1 cap AM & PM)   Omega 3 1000 MG CAPS Take 1 capsule by mouth 2 (two) times daily. Juice plus brand with 5 omegas   No facility-administered encounter medications on file as of 01/25/2024.   Hearing/Vision screen No results found. Immunizations and Health Maintenance Health Maintenance  Topic Date Due   Zoster Vaccines- Shingrix  (1 of 2) 10/31/1965   DTaP/Tdap/Td (2 - Td or Tdap) 08/26/2019   Medicare Annual Wellness (AWV)  02/18/2023   COVID-19 Vaccine (5 - 2025-26 season) 10/31/2023   Mammogram  11/08/2024   Pneumococcal Vaccine: 50+ Years  Completed   Influenza Vaccine  Completed   Bone Density Scan  Completed   Hepatitis C Screening  Addressed   Meningococcal B Vaccine  Aged Out   Colonoscopy  Discontinued        Assessment/Plan:  This is a routine wellness examination for [place].  Patient Care Team: Justus Leita DEL, MD as PCP - General (Internal Medicine) Reeta Bard POUR, MD (Gastroenterology) Dr. Rosina Mau (Vascular Surgery) Cathlyn Seal, MD (Dermatology)  I have personally reviewed and noted the following in the patient's chart:   Medical and social history Use of alcohol, tobacco or illicit drugs  Current medications and supplements including opioid prescriptions. Functional ability and status Nutritional status Physical activity Advanced directives List of other physicians Hospitalizations, surgeries, and ER visits in previous 12 months Vitals Screenings to include cognitive, depression, and falls Referrals and appointments  No orders of the defined types were placed in this encounter.  In addition, I have reviewed and discussed with patient certain preventive protocols, quality metrics, and best practice recommendations. A written personalized care plan for preventive services as well as general preventive health recommendations were provided to patient.   Jhonnie GORMAN Das, LPN   [id number]   No follow-ups on file.  After  Visit Summary: (MyChart) Due to this being a telephonic visit, the after visit summary with patients personalized plan was offered to patient via MyChart   Nurse Notes: UTD ON SHOTS, NEEDS 2ND SHINGRIX , TDAP; UTD ON MAMMOGRAM, BDS; AGED OUT OF COLONOSCOPY

## 2024-01-25 NOTE — Patient Instructions (Addendum)
 Dana Cameron,  Thank you for taking the time for your Medicare Wellness Visit. I appreciate your continued commitment to your health goals. Please review the care plan we discussed, and feel free to reach out if I can assist you further.  Please note that Annual Wellness Visits do not include a physical exam. Some assessments may be limited, especially if the visit was conducted virtually. If needed, we may recommend an in-person follow-up with your provider.  Ongoing Care Seeing your primary care provider every 3 to 6 months helps us  monitor your health and provide consistent, personalized care.   Referrals If a referral was made during today's visit and you haven't received any updates within two weeks, please contact the referred provider directly to check on the status.  Recommended Screenings:  Health Maintenance  Topic Date Due   DTaP/Tdap/Td vaccine (2 - Td or Tdap) 08/26/2019   COVID-19 Vaccine (5 - 2025-26 season) 10/31/2023   Zoster (Shingles) Vaccine (2 of 2) 02/20/2024   Breast Cancer Screening  11/08/2024   Medicare Annual Wellness Visit  01/24/2025   Osteoporosis screening with Bone Density Scan  11/09/2026   Pneumococcal Vaccine for age over 78  Completed   Flu Shot  Completed   Hepatitis C Screening  Addressed   Meningitis B Vaccine  Aged Out   Colon Cancer Screening  Discontinued   Vision: Annual vision screenings are recommended for early detection of glaucoma, cataracts, and diabetic retinopathy. These exams can also reveal signs of chronic conditions such as diabetes and high blood pressure.  Dental: Annual dental screenings help detect early signs of oral cancer, gum disease, and other conditions linked to overall health, including heart disease and diabetes.  Please see the attached documents for additional preventive care recommendations.   NEXT AWV 02/14/25 @ 2:30 PM IN PERSON

## 2024-02-13 ENCOUNTER — Ambulatory Visit (INDEPENDENT_AMBULATORY_CARE_PROVIDER_SITE_OTHER): Admitting: Student

## 2024-02-13 ENCOUNTER — Encounter: Payer: Self-pay | Admitting: Student

## 2024-02-13 VITALS — BP 147/76 | HR 67 | Ht 66.0 in | Wt 174.0 lb

## 2024-02-13 DIAGNOSIS — M1712 Unilateral primary osteoarthritis, left knee: Secondary | ICD-10-CM

## 2024-02-13 DIAGNOSIS — E538 Deficiency of other specified B group vitamins: Secondary | ICD-10-CM | POA: Diagnosis not present

## 2024-02-13 DIAGNOSIS — J3089 Other allergic rhinitis: Secondary | ICD-10-CM

## 2024-02-13 DIAGNOSIS — R030 Elevated blood-pressure reading, without diagnosis of hypertension: Secondary | ICD-10-CM | POA: Insufficient documentation

## 2024-02-13 DIAGNOSIS — M8580 Other specified disorders of bone density and structure, unspecified site: Secondary | ICD-10-CM | POA: Diagnosis not present

## 2024-02-13 DIAGNOSIS — M1990 Unspecified osteoarthritis, unspecified site: Secondary | ICD-10-CM | POA: Insufficient documentation

## 2024-02-13 DIAGNOSIS — E785 Hyperlipidemia, unspecified: Secondary | ICD-10-CM

## 2024-02-13 NOTE — Progress Notes (Signed)
 Established Patient Office Visit  Subjective   Patient ID: Dana Cameron, female    DOB: 03/17/1945  Age: 78 y.o. MRN: [id number]  Chief Complaint  Patient presents with   Transitions Of Care    Dana Cameron is a 78 y.o. person with medical hx listed below who presents today for transfer of care. No acute complaints today. Please refer to problem based charting for further details and assessment and plan of current problem and chronic medical conditions.  Patient Active Problem List   Diagnosis Date Noted   Osteoarthritis 02/13/2024   Elevated blood pressure reading without diagnosis of hypertension 02/13/2024   Osteopenia determined by x-ray 06/28/2023   Venous stasis 06/28/2023   Special screening for malignant neoplasms, colon    History of nonmelanoma skin cancer 12/31/2019   Vitamin B 12 deficiency 08/01/2018   Varicose vein of leg 07/11/2018   Colon polyp, hyperplastic 01/28/2017   Arthritis of right hip 01/26/2016   Hyperlipidemia, mild 03/28/2015   Fibrocystic breast 09/17/2014   Environmental and seasonal allergies 09/17/2014   Pelvic prolapse 09/17/2014   Hot flash, menopausal 09/17/2014      ROS Refer to HPI    Objective:     Outpatient Encounter Medications as of 02/13/2024  Medication Sig   albuterol  (VENTOLIN  HFA) 108 (90 Base) MCG/ACT inhaler Inhale 2 puffs into the lungs every 4 (four) hours as needed for wheezing or shortness of breath.   Cyanocobalamin (VITAMIN B-12 PO) Take by mouth as needed (drops).   ipratropium (ATROVENT ) 0.06 % nasal spray Place 2 sprays into both nostrils 4 (four) times daily.   Nutritional Supplements (JUICE PLUS FIBRE PO) Take by mouth. Takes: Juice Plus fruits, (2 caps AM), Juice Plus Veggies (2 caps PM), Juice Plus berries (1 cap AM & PM)   Omega 3 1000 MG CAPS Take 1 capsule by mouth 2 (two) times daily. Juice plus brand with 5 omegas   [DISCONTINUED] cetirizine (ZYRTEC) 10 MG tablet Take 10 mg by mouth as  needed.   [DISCONTINUED] FLUZONE HIGH-DOSE 0.5 ML injection    No facility-administered encounter medications on file as of 02/13/2024.    BP (!) 147/76   Pulse 67   Ht 5' 6 (1.676 m)   Wt 174 lb (78.9 kg)   SpO2 97%   BMI 28.08 kg/m  BP Readings from Last 3 Encounters:  02/13/24 (!) 147/76  06/28/23 118/74  12/24/22 (!) 157/88    Physical Exam Constitutional:      Appearance: Normal appearance.  HENT:     Head: Normocephalic and atraumatic.     Mouth/Throat:     Mouth: Mucous membranes are moist.     Pharynx: Oropharynx is clear.  Cardiovascular:     Rate and Rhythm: Normal rate and regular rhythm.     Heart sounds: No murmur heard. Pulmonary:     Effort: Pulmonary effort is normal.     Breath sounds: No rhonchi or rales.  Abdominal:     General: Abdomen is flat. Bowel sounds are normal. There is no distension.     Palpations: Abdomen is soft.     Tenderness: There is no abdominal tenderness.  Musculoskeletal:        General: Normal range of motion.     Right lower leg: No edema.     Left lower leg: No edema.  Skin:    General: Skin is warm and dry.     Capillary Refill: Capillary refill takes less than 2 seconds.  Neurological:     General: No focal deficit present.     Mental Status: She is alert and oriented to person, place, and time.  Psychiatric:        Mood and Affect: Mood normal.        Behavior: Behavior normal.        01/25/2024    2:55 PM 06/28/2023    2:41 PM 02/17/2022    8:37 AM  Depression screen PHQ 2/9  Decreased Interest 0 0 0  Down, Depressed, Hopeless 0 0 0  PHQ - 2 Score 0 0 0  Altered sleeping 0 0   Tired, decreased energy 0 0   Change in appetite 0 0   Feeling bad or failure about yourself  0 0   Trouble concentrating 0 1   Moving slowly or fidgety/restless 0 0   Suicidal thoughts 0 0   PHQ-9 Score 0 1    Difficult doing work/chores Not difficult at all Not difficult at all      Data saved with a previous flowsheet row  definition       06/28/2023    2:42 PM 01/07/2021    8:54 AM 08/23/2019    1:50 PM  GAD 7 : Generalized Anxiety Score  Nervous, Anxious, on Edge 0 0 0  Control/stop worrying 0 0 0  Worry too much - different things 0 0 0  Trouble relaxing 0 0 0  Restless 0 0 0  Easily annoyed or irritable 0 0 0  Afraid - awful might happen 0 0 0  Total GAD 7 Score 0 0 0  Anxiety Difficulty Not difficult at all Not difficult at all Not difficult at all    No results found for any visits on 02/13/24.  Last CBC Lab Results  Component Value Date   WBC 5.3 06/28/2023   HGB 12.8 06/28/2023   HCT 39.0 06/28/2023   MCV 94 06/28/2023   MCH 31.0 06/28/2023   RDW 12.6 06/28/2023   PLT 276 06/28/2023   Last metabolic panel Lab Results  Component Value Date   GLUCOSE 80 06/28/2023   NA 141 06/28/2023   K 4.3 06/28/2023   CL 105 06/28/2023   CO2 23 06/28/2023   BUN 12 06/28/2023   CREATININE 0.85 06/28/2023   EGFR 71 06/28/2023   CALCIUM 9.1 06/28/2023   PROT 6.4 06/28/2023   ALBUMIN 4.3 06/28/2023   LABGLOB 2.1 06/28/2023   AGRATIO 2.0 01/30/2018   BILITOT 0.4 06/28/2023   ALKPHOS 98 06/28/2023   AST 18 06/28/2023   ALT 12 06/28/2023   Last lipids Lab Results  Component Value Date   CHOL 149 06/28/2023   HDL 56 06/28/2023   LDLCALC 75 06/28/2023   TRIG 97 06/28/2023   CHOLHDL 2.7 06/28/2023   Last hemoglobin A1c No results found for: HGBA1C Last thyroid  functions Lab Results  Component Value Date   TSH 2.090 06/28/2023      The 10-year ASCVD risk score (Arnett DK, et al., 2019) is: 25%    Assessment & Plan:  Elevated blood pressure reading without diagnosis of hypertension Assessment & Plan: BP elevated today. No hx of hypertension although has been elevated intermittently in the past. Will have her keep BP log and bring to next appointment. If remains elevated will discuss antihypertensives. Discussed low salt diet and exercise.    Primary osteoarthritis of left  knee Assessment & Plan: Follows with [facility].    Osteopenia determined by x-ray Assessment & Plan:  Osteopenia on DXA from 10/2023, is physically active but does not do weight bearing exercise or take vitamin D /calcium. Recommended D3 100 units daily and calcium 1200 mg daily. She will work on incorporating weight bearing exercise.    Hyperlipidemia, mild Assessment & Plan: The 10-year ASCVD risk score (Arnett DK, et al., 2019) is: 25%. Prefers not to be on medication. Continue diet and exercise.     Component Value Date/Time   CHOL 149 06/28/2023 1532   TRIG 97 06/28/2023 1532   HDL 56 06/28/2023 1532   CHOLHDL 2.7 06/28/2023 1532   LDLCALC 75 06/28/2023 1532   LABVLDL 18 06/28/2023 1532     Environmental and seasonal allergies Assessment & Plan: Uses atrovent  nasal spray as needed, recommended nasal irrigation.    Vitamin B 12 deficiency Assessment & Plan: Using vitamin B12 drops 1 week out of the month. Thinks dose us  about 5000 mg daily. Recommend 1000 mg of b12 daily. She will work on taking this regularly, repeat b12 next visit.      Return in about 3 months (around 05/13/2024) for HTN.    Harlene Saddler, MD

## 2024-02-13 NOTE — Assessment & Plan Note (Addendum)
 Osteopenia on DXA from 10/2023, is physically active but does not do weight bearing exercise or take vitamin D /calcium. Recommended D3 100 units daily and calcium 1200 mg daily. She will work on incorporating weight bearing exercise.

## 2024-02-13 NOTE — Assessment & Plan Note (Signed)
 Managed by Dr. Rosina Mau VS yearly.

## 2024-02-13 NOTE — Assessment & Plan Note (Signed)
 Follows with [facility].

## 2024-02-13 NOTE — Assessment & Plan Note (Addendum)
 The 10-year ASCVD risk score (Arnett DK, et al., 2019) is: 25%. Prefers not to be on medication. Continue diet and exercise.     Component Value Date/Time   CHOL 149 06/28/2023 1532   TRIG 97 06/28/2023 1532   HDL 56 06/28/2023 1532   CHOLHDL 2.7 06/28/2023 1532   LDLCALC 75 06/28/2023 1532   LABVLDL 18 06/28/2023 1532

## 2024-02-13 NOTE — Assessment & Plan Note (Signed)
 BP elevated today. No hx of hypertension although has been elevated intermittently in the past. Will have her keep BP log and bring to next appointment. If remains elevated will discuss antihypertensives. Discussed low salt diet and exercise.

## 2024-02-13 NOTE — Assessment & Plan Note (Addendum)
 Uses atrovent  nasal spray as needed, recommended nasal irrigation.

## 2024-02-13 NOTE — Assessment & Plan Note (Addendum)
 Using vitamin B12 drops 1 week out of the month. Thinks dose us  about 5000 mg daily. Recommend 1000 mg of b12 daily. She will work on taking this regularly, repeat b12 next visit.

## 2024-02-17 NOTE — Progress Notes (Signed)
 "  No chief complaint on file.   I connected with  Dana Cameron on 02/17/2024 by a audio enabled telemedicine application and verified that I am speaking with the correct person using two identifiers.  Patient Location: Home  Provider Location: Office/Clinic  Persons Participating in Visit: Patient.  I discussed the limitations of evaluation and management by telemedicine. The patient expressed understanding and agreed to proceed.   Vital Signs: Because this visit was a virtual/telehealth visit, some criteria may be missing or patient reported. Any vitals not documented were not able to be obtained and vitals that have been documented are patient reported.      Subjective:   Dana Cameron is a 78 y.o. female who presents for a Medicare Annual Wellness Visit.  Allergies (verified) Latex, Other, and Tape   History: Past Medical History:  Diagnosis Date   Allergy    Fall Seasonal Allergies   Arthritis of right hip    Cataract    Cateract Surgery 6 & 7, 2022   Hyperlipidemia    PONV (postoperative nausea and vomiting)    Special screening for malignant neoplasms, colon    Vaginal prolapse    Varicose veins of legs    has been treated   Past Surgical History:  Procedure Laterality Date   BREAST BIOPSY Left 1993   neg   CATARACT EXTRACTION W/PHACO Left 08/12/2020   Procedure: CATARACT EXTRACTION PHACO AND INTRAOCULAR LENS PLACEMENT (IOC) LEFT;  Surgeon: Jaye Fallow, MD;  Location: [facility];  Service: Ophthalmology;  Laterality: Left;  6.89 00:41.2   CATARACT EXTRACTION W/PHACO Right 09/23/2020   Procedure: CATARACT EXTRACTION PHACO AND INTRAOCULAR LENS PLACEMENT (IOC) RIGHT 3.01 00:22.6 ;  Surgeon: Jaye Fallow, MD;  Location: [facility];  Service: Ophthalmology;  Laterality: Right;   COLONOSCOPY  12/31/2003   one hyperplastic polyp   COLONOSCOPY WITH PROPOFOL  N/A 01/07/2020   Procedure: COLONOSCOPY WITH PROPOFOL ;  Surgeon:  Jinny Carmine, MD;  Location: [facility];  Service: Endoscopy;  Laterality: N/A;  priority 4   EXCISION VAGINAL CYST     EYE SURGERY  June/July 2022   Dr. Jaye - [facility]   TOTAL KNEE ARTHROPLASTY Right 07/26/2017   Family History  Problem Relation Age of Onset   Transient ischemic attack Mother    Stroke Mother    Hypertension Father    Stroke Father    Asthma Sister    Lung cancer Brother    Cancer Brother    Varicose Veins Daughter    Breast cancer Neg Hx    Social History   Occupational History   Occupation: retired  Tobacco Use   Smoking status: Never   Smokeless tobacco: Never  Vaping Use   Vaping status: Never Used  Substance and Sexual Activity   Alcohol use: No    Alcohol/week: 0.0 standard drinks of alcohol   Drug use: No   Sexual activity: Not Currently    Birth control/protection: None   Tobacco Counseling Counseling given: Not Answered  SDOH Screenings   Food Insecurity: No Food Insecurity (01/25/2024)  Housing: Unknown (01/25/2024)  Transportation Needs: No Transportation Needs (01/25/2024)  Utilities: Not At Risk (01/25/2024)  Alcohol Screen: Low Risk (02/17/2022)  Depression (PHQ2-9): Low Risk (01/25/2024)  Financial Resource Strain: Low Risk (06/28/2023)  Physical Activity: Insufficiently Active (01/25/2024)  Social Connections: Socially Integrated (01/25/2024)  Stress: No Stress Concern Present (01/25/2024)  Tobacco Use: Low Risk (02/13/2024)  Health Literacy: Adequate Health Literacy (01/25/2024)  See flowsheets for full screening details  Depression Screen PHQ 2 & 9 Depression Scale- Over the past 2 weeks, how often have you been bothered by any of the following problems? Little interest or pleasure in doing things: 0 Feeling down, depressed, or hopeless (PHQ Adolescent also includes...irritable): 0 PHQ-2 Total Score: 0 Trouble falling or staying asleep, or sleeping too much: 0 Feeling tired or having little energy:  0 Poor appetite or overeating (PHQ Adolescent also includes...weight loss): 0 Feeling bad about yourself - or that you are a failure or have let yourself or your family down: 0 Trouble concentrating on things, such as reading the newspaper or watching television (PHQ Adolescent also includes...like school work): 0 Moving or speaking so slowly that other people could have noticed. Or the opposite - being so fidgety or restless that you have been moving around a lot more than usual: 0 Thoughts that you would be better off dead, or of hurting yourself in some way: 0 PHQ-9 Total Score: 0 If you checked off any problems, how difficult have these problems made it for you to do your work, take care of things at home, or get along with other people?: Not difficult at all  Depression Treatment Depression Interventions/Treatment : PHQ2-9 Score <4 Follow-up Not Indicated     Goals Addressed             This Visit's Progress    DIET - EAT MORE FRUITS AND VEGETABLES         Visit info / Clinical Intake: Medicare Wellness Visit Type:: Subsequent Annual Wellness Visit Persons participating in visit and providing information:: patient Medicare Wellness Visit Mode:: Telephone If telephone:: video declined Since this visit was completed virtually, some vitals may be partially provided or unavailable. Missing vitals are due to the limitations of the virtual format.: unable to obtan vitals due to lack of equipment If Telephone or Video please confirm:: I connected with the patient using audio enabled telemedicine application and verified that I am speaking with the correct person using two identifiers; I discussed the limitations of evaluation and management by telemedicine; The patient expressed understanding and agreed to proceed Patient Location:: home Provider Location:: office Interpreter Needed?: No Pre-visit prep was completed: yes AWV questionnaire completed by patient prior to visit?:  no Living arrangements:: lives with spouse/significant other Patient's Overall Health Status Rating: excellent Typical amount of pain: none Does pain affect daily life?: no Are you currently prescribed opioids?: no  Dietary Habits and Nutritional Risks How many meals a day?: 3 Eats fruit and vegetables daily?: yes Most meals are obtained by: preparing own meals; eating out In the last 2 weeks, have you had any of the following?: none Diabetic:: no  Functional Status Activities of Daily Living (to include ambulation/medication): Independent Ambulation: Independent Medication Administration: Independent Home Management (perform basic housework or laundry): Independent Manage your own finances?: yes Primary transportation is: driving Concerns about vision?: no *vision screening is required for [facility]* (wears glasses- Dr.Porfilio- seen yearly) Concerns about hearing?: no  Fall Screening Falls in the past year?: 0 Number of falls in past year: 0 Was there an injury with Fall?: 0 Fall Risk Category Calculator: 0 Patient Fall Risk Level: Low Fall Risk  Fall Risk Patient at Risk for Falls Due to: No Fall Risks Fall risk Follow up: Falls evaluation completed; Falls prevention discussed  Home and Transportation Safety: All rugs have non-skid backing?: yes All stairs or steps have railings?: yes Grab bars in the bathtub or  shower?: (!) no Have non-skid surface in bathtub or shower?: (!) no Good home lighting?: yes Regular seat belt use?: yes Hospital stays in the last year:: no  Cognitive Assessment Difficulty concentrating, remembering, or making decisions? : no Will 6CIT or Mini Cog be Completed: yes What year is it?: 0 points What month is it?: 0 points Give patient an address phrase to remember (5 components): [address] [place], [place] About what time is it?: 0 points Count backwards from 20 to 1: 0 points Say the months of the year in reverse: 0 points Repeat the  address phrase from earlier: 0 points 6 CIT Score: 0 points  Advance Directives (For Healthcare) Does Patient Have a Medical Advance Directive?: No Would patient like information on creating a medical advance directive?: No - Patient declined  Reviewed/Updated  Reviewed/Updated: Reviewed All (Medical, Surgical, Family, Medications, Allergies, Care Teams, Patient Goals)        Objective:    There were no vitals filed for this visit. There is no height or weight on file to calculate BMI.  Current Medications (verified) Outpatient Encounter Medications as of 01/25/2024  Medication Sig   albuterol  (VENTOLIN  HFA) 108 (90 Base) MCG/ACT inhaler Inhale 2 puffs into the lungs every 4 (four) hours as needed for wheezing or shortness of breath.   ipratropium (ATROVENT ) 0.06 % nasal spray Place 2 sprays into both nostrils 4 (four) times daily.   Nutritional Supplements (JUICE PLUS FIBRE PO) Take by mouth. Takes: Juice Plus fruits, (2 caps AM), Juice Plus Veggies (2 caps PM), Juice Plus berries (1 cap AM & PM)   Omega 3 1000 MG CAPS Take 1 capsule by mouth 2 (two) times daily. Juice plus brand with 5 omegas   [DISCONTINUED] FLUZONE HIGH-DOSE 0.5 ML injection    [DISCONTINUED] cetirizine (ZYRTEC) 10 MG tablet Take 10 mg by mouth as needed.   No facility-administered encounter medications on file as of 01/25/2024.   Hearing/Vision screen Hearing Screening - Comments:: NO AIDS Vision Screening - Comments:: WEARS GLASSES- DR.PORFILIO - SEEN YEARLY Immunizations and Health Maintenance Health Maintenance  Topic Date Due   DTaP/Tdap/Td (2 - Td or Tdap) 08/26/2019   COVID-19 Vaccine (5 - 2025-26 season) 10/31/2023   Zoster Vaccines- Shingrix  (2 of 2) 02/20/2024   Mammogram  11/08/2024   Medicare Annual Wellness (AWV)  01/24/2025   Bone Density Scan  11/09/2026   Pneumococcal Vaccine: 50+ Years  Completed   Influenza Vaccine  Completed   Hepatitis C Screening  Addressed   Meningococcal B  Vaccine  Aged Out   Colonoscopy  Discontinued        Assessment/Plan:  This is a routine wellness examination for Dana Cameron.  Patient Care Team: Lemon Raisin, MD as PCP - General (Internal Medicine) Reeta Bard POUR, MD (Gastroenterology) Dr. Rosina Mau (Vascular Surgery) Cathlyn Seal, MD (Dermatology) Jaye Fallow, MD as Referring Physician (Ophthalmology)  I have personally reviewed and noted the following in the patients chart:   Medical and social history Use of alcohol, tobacco or illicit drugs  Current medications and supplements including opioid prescriptions. Functional ability and status Nutritional status Physical activity Advanced directives List of other physicians Hospitalizations, surgeries, and ER visits in previous 12 months Vitals Screenings to include cognitive, depression, and falls Referrals and appointments  No orders of the defined types were placed in this encounter.  In addition, I have reviewed and discussed with patient certain preventive protocols, quality metrics, and best practice recommendations. A written personalized care plan  for preventive services as well as general preventive health recommendations were provided to patient.   Jhonnie GORMAN Das, LPN   [id number]   Return in 1 year (on 01/24/2025).  After Visit Summary: (MyChart) Due to this being a telephonic visit, the after visit summary with patients personalized plan was offered to patient via MyChart   Nurse Notes: UTD ON SHOTS, NEEDS 2ND SHINGRIX , TDAP; UTD ON MAMMOGRAM, BDS; AGED OUT OF COLONOSCOPY   "

## 2024-06-13 ENCOUNTER — Ambulatory Visit: Admitting: Student

## 2025-02-14 ENCOUNTER — Ambulatory Visit
